# Patient Record
Sex: Female | Born: 1960 | Race: White | Hispanic: No | Smoking: Current every day smoker
Health system: Southern US, Community
[De-identification: ages and names within clinical notes are randomized; demographics above are authoritative.]

## PROBLEM LIST (undated history)

## (undated) DIAGNOSIS — F32A Depression, unspecified: Secondary | ICD-10-CM

## (undated) DIAGNOSIS — F329 Major depressive disorder, single episode, unspecified: Secondary | ICD-10-CM

## (undated) DIAGNOSIS — I10 Essential (primary) hypertension: Secondary | ICD-10-CM

## (undated) DIAGNOSIS — F419 Anxiety disorder, unspecified: Secondary | ICD-10-CM

## (undated) HISTORY — DX: Anxiety disorder, unspecified: F41.9

## (undated) HISTORY — DX: Depression, unspecified: F32.A

## (undated) HISTORY — PX: ABDOMINAL HYSTERECTOMY: SHX81

---
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
## 2008-12-04 HISTORY — PX: ABDOMINAL HYSTERECTOMY: SHX81

## 2018-12-04 ENCOUNTER — Encounter: Payer: Self-pay | Admitting: Emergency Medicine

## 2018-12-04 ENCOUNTER — Emergency Department
Admission: EM | Admit: 2018-12-04 | Discharge: 2018-12-04 | Disposition: A | Discharge status: Home / Self Care | Payer: Self-pay | Attending: Emergency Medicine | Admitting: Emergency Medicine

## 2018-12-04 ENCOUNTER — Other Ambulatory Visit: Payer: Self-pay

## 2018-12-04 DIAGNOSIS — K2950 Unspecified chronic gastritis without bleeding: Secondary | ICD-10-CM

## 2018-12-04 DIAGNOSIS — I10 Essential (primary) hypertension: Secondary | ICD-10-CM | POA: Insufficient documentation

## 2018-12-04 DIAGNOSIS — E860 Dehydration: Secondary | ICD-10-CM | POA: Insufficient documentation

## 2018-12-04 DIAGNOSIS — F17210 Nicotine dependence, cigarettes, uncomplicated: Secondary | ICD-10-CM | POA: Insufficient documentation

## 2018-12-04 DIAGNOSIS — K2930 Chronic superficial gastritis without bleeding: Secondary | ICD-10-CM | POA: Insufficient documentation

## 2018-12-04 HISTORY — DX: Essential (primary) hypertension: I10

## 2018-12-04 LAB — LIPASE, BLOOD: Lipase: 19 U/L (ref 11–51)

## 2018-12-04 LAB — CBC WITH DIFFERENTIAL/PLATELET
Abs Immature Granulocytes: 0.1 10*3/uL — ABNORMAL HIGH (ref 0.00–0.07)
BASOS PCT: 0 %
Basophils Absolute: 0 10*3/uL (ref 0.0–0.1)
Eosinophils Absolute: 0 10*3/uL (ref 0.0–0.5)
Eosinophils Relative: 0 %
HCT: 49.5 % — ABNORMAL HIGH (ref 36.0–46.0)
Hemoglobin: 17.3 g/dL — ABNORMAL HIGH (ref 12.0–15.0)
Immature Granulocytes: 1 %
Lymphocytes Relative: 9 %
Lymphs Abs: 1.4 10*3/uL (ref 0.7–4.0)
MCH: 32.4 pg (ref 26.0–34.0)
MCHC: 34.9 g/dL (ref 30.0–36.0)
MCV: 92.7 fL (ref 80.0–100.0)
Monocytes Absolute: 0.5 10*3/uL (ref 0.1–1.0)
Monocytes Relative: 3 %
NRBC: 0 % (ref 0.0–0.2)
Neutro Abs: 14.1 10*3/uL — ABNORMAL HIGH (ref 1.7–7.7)
Neutrophils Relative %: 87 %
Platelets: 406 10*3/uL — ABNORMAL HIGH (ref 150–400)
RBC: 5.34 MIL/uL — ABNORMAL HIGH (ref 3.87–5.11)
RDW: 14.3 % (ref 11.5–15.5)
WBC: 16.1 10*3/uL — ABNORMAL HIGH (ref 4.0–10.5)

## 2018-12-04 LAB — TROPONIN I: Troponin I: <0.03 ng/mL (ref <0.03)

## 2018-12-04 LAB — COMPREHENSIVE METABOLIC PANEL
ALT: 46 U/L — ABNORMAL HIGH (ref 0–44)
AST: 41 U/L (ref 15–41)
Albumin: 5 g/dL (ref 3.5–5.0)
Alkaline Phosphatase: 70 U/L (ref 38–126)
Anion gap: 16 — ABNORMAL HIGH (ref 5–15)
BUN: 20 mg/dL (ref 6–20)
CO2: 22 mmol/L (ref 22–32)
Calcium: 10.2 mg/dL (ref 8.9–10.3)
Chloride: 97 mmol/L — ABNORMAL LOW (ref 98–111)
Creatinine, Ser: 1.16 mg/dL — ABNORMAL HIGH (ref 0.44–1.00)
GFR calc Af Amer: >60 mL/min (ref >60)
GFR calc non Af Amer: 53 mL/min — ABNORMAL LOW (ref >60)
Glucose, Bld: 206 mg/dL — ABNORMAL HIGH (ref 70–99)
POTASSIUM: 3.6 mmol/L (ref 3.5–5.1)
Sodium: 135 mmol/L (ref 135–145)
Total Bilirubin: 1.1 mg/dL (ref 0.3–1.2)
Total Protein: 8.8 g/dL — ABNORMAL HIGH (ref 6.5–8.1)

## 2018-12-04 MED ORDER — ONDANSETRON HCL 4 MG/2ML IJ SOLN
4.0000 mg | Freq: Once | INTRAMUSCULAR | Status: AC
  Administered 2018-12-04: 4 mg via INTRAVENOUS
  Filled 2018-12-04: qty 2

## 2018-12-04 MED ORDER — SODIUM CHLORIDE 0.9 % IV BOLUS
1000.0000 mL | Freq: Once | INTRAVENOUS | Status: AC
  Administered 2018-12-04: 1000 mL via INTRAVENOUS

## 2018-12-04 MED ORDER — PANTOPRAZOLE SODIUM 40 MG IV SOLR
40.0000 mg | Freq: Once | INTRAVENOUS | Status: AC
  Administered 2018-12-04: 40 mg via INTRAVENOUS
  Filled 2018-12-04: qty 40

## 2018-12-04 MED ORDER — SUCRALFATE 1 G PO TABS: 1.0000 g | ORAL_TABLET | Freq: Four times a day (QID) | ORAL | 0 refills | Status: DC | PRN

## 2018-12-04 MED ORDER — MISOPROSTOL 200 MCG PO TABS
200.0000 ug | ORAL_TABLET | Freq: Once | ORAL | Status: AC
  Administered 2018-12-04: 200 ug via ORAL
  Filled 2018-12-04: qty 1

## 2018-12-04 MED ORDER — ONDANSETRON 4 MG PO TBDP: 4.0000 mg | ORAL_TABLET | Freq: Three times a day (TID) | ORAL | 0 refills | Status: DC | PRN

## 2018-12-04 MED ORDER — ALUM & MAG HYDROXIDE-SIMETH 200-200-20 MG/5ML PO SUSP
30.0000 mL | Freq: Once | ORAL | Status: AC
  Administered 2018-12-04: 30 mL via ORAL
  Filled 2018-12-04: qty 30

## 2018-12-04 MED ORDER — PANTOPRAZOLE SODIUM 40 MG PO TBEC: 40.0000 mg | DELAYED_RELEASE_TABLET | Freq: Every day | ORAL | 0 refills | Status: DC

## 2018-12-04 MED ORDER — SUCRALFATE 1 G PO TABS
1.0000 g | ORAL_TABLET | Freq: Once | ORAL | Status: AC
  Administered 2018-12-04: 1 g via ORAL
  Filled 2018-12-04: qty 1

## 2018-12-04 MED ORDER — LORAZEPAM 2 MG/ML IJ SOLN
1.0000 mg | Freq: Once | INTRAMUSCULAR | Status: AC
  Administered 2018-12-04: 1 mg via INTRAVENOUS
  Filled 2018-12-04: qty 1

## 2018-12-04 MED ORDER — MORPHINE SULFATE (PF) 4 MG/ML IV SOLN
4.0000 mg | Freq: Once | INTRAVENOUS | Status: AC
  Administered 2018-12-04: 4 mg via INTRAVENOUS
  Filled 2018-12-04: qty 1

## 2018-12-04 MED ORDER — HYDROCODONE-ACETAMINOPHEN 5-325 MG PO TABS
2.0000 | ORAL_TABLET | Freq: Once | ORAL | Status: AC
  Administered 2018-12-04: 2 via ORAL
  Filled 2018-12-04: qty 2

## 2018-12-04 MED ORDER — FAMOTIDINE IN NACL 20-0.9 MG/50ML-% IV SOLN
20.0000 mg | Freq: Once | INTRAVENOUS | Status: AC
  Administered 2018-12-04: 20 mg via INTRAVENOUS
  Filled 2018-12-04: qty 50

## 2018-12-04 NOTE — Discharge Instructions (Signed)
Please make sure you remain well hydrated and follow up with the GI specialist next week for a recheck.  Resume taking your protonix every day.  Return to the ED for any concerns.  It was a pleasure to take care of you today, and thank you for coming to our emergency department.  If you have any questions or concerns before leaving please ask the nurse to grab me and I'm more than happy to go through your aftercare instructions again.  If you were prescribed any opioid pain medication today such as Norco, Vicodin, Percocet, morphine, hydrocodone, or oxycodone please make sure you do not drive when you are taking this medication as it can alter your ability to drive safely.  If you have any concerns once you are home that you are not improving or are in fact getting worse before you can make it to your follow-up appointment, please do not hesitate to call 911 and come back for further evaluation.  Merrily Brittle, MD  Results for orders placed or performed during the hospital encounter of 12/04/18  CBC with Differential  Result Value Ref Range   WBC 16.1 (H) 4.0 - 10.5 K/uL   RBC 5.34 (H) 3.87 - 5.11 MIL/uL   Hemoglobin 17.3 (H) 12.0 - 15.0 g/dL   HCT 01.0 (H) 07.1 - 21.9 %   MCV 92.7 80.0 - 100.0 fL   MCH 32.4 26.0 - 34.0 pg   MCHC 34.9 30.0 - 36.0 g/dL   RDW 75.8 83.2 - 54.9 %   Platelets 406 (H) 150 - 400 K/uL   nRBC 0.0 0.0 - 0.2 %   Neutrophils Relative % 87 %   Neutro Abs 14.1 (H) 1.7 - 7.7 K/uL   Lymphocytes Relative 9 %   Lymphs Abs 1.4 0.7 - 4.0 K/uL   Monocytes Relative 3 %   Monocytes Absolute 0.5 0.1 - 1.0 K/uL   Eosinophils Relative 0 %   Eosinophils Absolute 0.0 0.0 - 0.5 K/uL   Basophils Relative 0 %   Basophils Absolute 0.0 0.0 - 0.1 K/uL   Immature Granulocytes 1 %   Abs Immature Granulocytes 0.10 (H) 0.00 - 0.07 K/uL  Comprehensive metabolic panel  Result Value Ref Range   Sodium 135 135 - 145 mmol/L   Potassium 3.6 3.5 - 5.1 mmol/L   Chloride 97 (L) 98 - 111  mmol/L   CO2 22 22 - 32 mmol/L   Glucose, Bld 206 (H) 70 - 99 mg/dL   BUN 20 6 - 20 mg/dL   Creatinine, Ser 8.26 (H) 0.44 - 1.00 mg/dL   Calcium 41.5 8.9 - 83.0 mg/dL   Total Protein 8.8 (H) 6.5 - 8.1 g/dL   Albumin 5.0 3.5 - 5.0 g/dL   AST 41 15 - 41 U/L   ALT 46 (H) 0 - 44 U/L   Alkaline Phosphatase 70 38 - 126 U/L   Total Bilirubin 1.1 0.3 - 1.2 mg/dL   GFR calc non Af Amer 53 (L) >60 mL/min   GFR calc Af Amer >60 >60 mL/min   Anion gap 16 (H) 5 - 15  Lipase, blood  Result Value Ref Range   Lipase 19 11 - 51 U/L  Troponin I - ONCE - STAT  Result Value Ref Range   Troponin I <0.03 <0.03 ng/mL

## 2018-12-04 NOTE — ED Provider Notes (Signed)
[facility] Emergency Department Provider Note  ____________________________________________   None    (approximate)  I have reviewed the triage vital signs and the nursing notes.   HISTORY  Chief Complaint Emesis   HPI Michele Carroll is a 58 y.o. female who comes to the emergency department with 2 days of left upper quadrant pain nausea vomiting and dehydration.  She has a longstanding history of gastritis and recently ran out of her Protonix.  She moved from [place] to [place] and has not yet found a primary care physician so she purchased over-the-counter omeprazole and resumed taking it about 5 days ago.  She had been off of her PPI for about 3 or 4 weeks prior to this.  Her symptoms have been slow in onset gradually progressive and are now moderate to severe.  She throws up when she is trying to keep down even water.  Her last endoscopy was at age 63 and she has not seen a GI physician since.  She has no history of abdominal surgeries aside from an abdominal hysterectomy.  She denies fevers or chills.  She denies chest pain.  She denies dysuria frequency or hesitancy.  Her symptoms are currently moderate to severe associated with a bitter taste in her mouth and are worsened when trying to eat.    Past Medical History:  Diagnosis Date  . Hypertension     There are no active problems to display for this patient.   Past Surgical History:  Procedure Laterality Date  . ABDOMINAL HYSTERECTOMY      Prior to Admission medications   Medication Sig Start Date End Date Taking? Authorizing Provider  ondansetron (ZOFRAN ODT) 4 MG disintegrating tablet Take 1 tablet (4 mg total) by mouth every 8 (eight) hours as needed for nausea or vomiting. 12/04/18   Merrily Brittle, MD  pantoprazole (PROTONIX) 40 MG tablet Take 1 tablet (40 mg total) by mouth daily. 12/04/18 01/03/19  Merrily Brittle, MD  sucralfate (CARAFATE) 1 g tablet Take 1 tablet (1 g total) by mouth 4  (four) times daily as needed (abdominal pain). 12/04/18 01/03/19  Merrily Brittle, MD    Allergies Patient has no known allergies.  No family history on file.  Social History Social History   Tobacco Use  . Smoking status: Current Every Day Smoker  . Smokeless tobacco: Never Used  Substance Use Topics  . Alcohol use: Not on file  . Drug use: Not on file    Review of Systems Constitutional: No fever/chills Eyes: No visual changes. ENT: No sore throat. Cardiovascular: Denies chest pain. Respiratory: Denies shortness of breath. Gastrointestinal: Positive for abdominal pain.  Positive for nausea, for vomiting.  No diarrhea.  No constipation. Genitourinary: Negative for dysuria. Musculoskeletal: Negative for back pain. Skin: Negative for rash. Neurological: Negative for headaches, focal weakness or numbness.   ____________________________________________   PHYSICAL EXAM:  VITAL SIGNS: ED Triage Vitals  Enc Vitals Group     BP 12/04/18 0333 (!) 146/87     Pulse Rate 12/04/18 0333 86     Resp 12/04/18 0333 20     Temp 12/04/18 0333 97.8 F (36.6 C)     Temp Source 12/04/18 0333 Oral     SpO2 12/04/18 0333 95 %     Weight 12/04/18 0329 210 lb (95.3 kg)     Height 12/04/18 0329 5\' 6"  (1.676 m)     Head Circumference --      Peak Flow --  Pain Score 12/04/18 0329 10     Pain Loc --      Pain Edu? --      Excl. in GC? --     Constitutional: Alert and oriented x4 appears quite uncomfortable although nontoxic in no diaphoresis.  She is holding a vomit bag Eyes: PERRL EOMI. Head: Atraumatic. Nose: No congestion/rhinnorhea. Mouth/Throat: No trismus Neck: No stridor.   Cardiovascular: Normal rate, regular rhythm. Grossly normal heart sounds.  Good peripheral circulation. Respiratory: Normal respiratory effort.  No retractions. Lungs CTAB and moving good air Gastrointestinal: Obese abdomen soft nondistended nontender no rebound or guarding no peritonitis no McBurney's  tenderness negative Rovsing's no costovertebral tenderness Musculoskeletal: No lower extremity edema   Neurologic:  Normal speech and language. No gross focal neurologic deficits are appreciated. Skin:  Skin is warm, dry and intact. No rash noted. Psychiatric: Mood and affect are normal. Speech and behavior are normal.    ____________________________________________   DIFFERENTIAL includes but not limited to  Appendicitis, diverticulitis, bowel obstruction, pyelonephritis, nephrolithiasis, gastritis, duodenitis ____________________________________________   LABS (all labs ordered are listed, but only abnormal results are displayed)  Labs Reviewed  CBC WITH DIFFERENTIAL/PLATELET - Abnormal; Notable for the following components:      Result Value   WBC 16.1 (*)    RBC 5.34 (*)    Hemoglobin 17.3 (*)    HCT 49.5 (*)    Platelets 406 (*)    Neutro Abs 14.1 (*)    Abs Immature Granulocytes 0.10 (*)    All other components within normal limits  COMPREHENSIVE METABOLIC PANEL - Abnormal; Notable for the following components:   Chloride 97 (*)    Glucose, Bld 206 (*)    Creatinine, Ser 1.16 (*)    Total Protein 8.8 (*)    ALT 46 (*)    GFR calc non Af Amer 53 (*)    Anion gap 16 (*)    All other components within normal limits  LIPASE, BLOOD  TROPONIN I  URINALYSIS, COMPLETE (UACMP) WITH MICROSCOPIC    Lab work reviewed by me with elevated white count which is nonspecific.  She has an increased hemoglobin which is consistent with dehydration and hemoconcentration.  She has a slight anion gap likely secondary to vomiting and acidemia __________________________________________  EKG  ED ECG REPORT I, Merrily BrittleNeil Lee Kuang, the attending physician, personally viewed and interpreted this ECG.  Date: 12/04/2018 EKG Time:  Rate: 81 Rhythm: normal sinus rhythm QRS Axis: Rightward axis Intervals: normal ST/T Wave abnormalities: normal Narrative Interpretation: no evidence of acute  ischemia  ____________________________________________  RADIOLOGY   ____________________________________________   PROCEDURES  Procedure(s) performed: no  Procedures  Critical Care performed: no  ____________________________________________   INITIAL IMPRESSION / ASSESSMENT AND PLAN / ED COURSE  Pertinent labs & imaging results that were available during my care of the patient were reviewed by me and considered in my medical decision making (see chart for details).   As part of my medical decision making, I reviewed the following data within the electronic MEDICAL RECORD NUMBER History obtained from family if available, nursing notes, old chart and ekg, as well as notes from prior ED visits.  Patient comes the emergency department quite uncomfortable appearing with left upper quadrant abdominal pain nausea and vomiting.  Her abdominal exam is quite benign.  Her lab work shows hemoconcentration and dehydration.  Her constellation of symptoms are most consistent with a gastric etiology and are likely related to her abrupt cessation of her PPI.  I discussed with the patient and her husband that PPIs can take at least 5 to 7 days to begin working again so the omeprazole that she recently started has likely yet to begin working.  We will fluid resuscitate her and treat her symptomatically with Carafate, Cytotec, Maalox, Pepcid, and a first dose of Protonix here and I will help her establish care with gastroenterology as an outpatient.  I anticipate discharge with resumption of her Protonix.      ____________________________________________   FINAL CLINICAL IMPRESSION(S) / ED DIAGNOSES  Final diagnoses:  Chronic gastritis without bleeding, unspecified gastritis type  Dehydration      NEW MEDICATIONS STARTED DURING THIS VISIT:  New Prescriptions   ONDANSETRON (ZOFRAN ODT) 4 MG DISINTEGRATING TABLET    Take 1 tablet (4 mg total) by mouth every 8 (eight) hours as needed for nausea or  vomiting.   PANTOPRAZOLE (PROTONIX) 40 MG TABLET    Take 1 tablet (40 mg total) by mouth daily.   SUCRALFATE (CARAFATE) 1 G TABLET    Take 1 tablet (1 g total) by mouth 4 (four) times daily as needed (abdominal pain).     Note:  This document was prepared using Dragon voice recognition software and may include unintentional dictation errors.    Merrily Brittle, MD 12/04/18 [phone]

## 2018-12-04 NOTE — ED Triage Notes (Signed)
Patient ambulatory to triage with steady gait, without difficulty or distress noted; pt reports N/V x 2 days accomp by mid epigastric pain

## 2019-05-06 ENCOUNTER — Other Ambulatory Visit: Payer: Self-pay

## 2019-05-08 ENCOUNTER — Telehealth: Payer: Self-pay | Admitting: Family Medicine

## 2019-05-08 ENCOUNTER — Encounter: Payer: Self-pay | Admitting: Family Medicine

## 2019-05-08 ENCOUNTER — Ambulatory Visit (INDEPENDENT_AMBULATORY_CARE_PROVIDER_SITE_OTHER): Payer: Self-pay

## 2019-05-08 ENCOUNTER — Other Ambulatory Visit: Payer: Self-pay

## 2019-05-08 ENCOUNTER — Ambulatory Visit: Payer: Self-pay | Admitting: Family Medicine

## 2019-05-08 ENCOUNTER — Ambulatory Visit: Payer: Self-pay | Admitting: Internal Medicine

## 2019-05-08 ENCOUNTER — Ambulatory Visit (INDEPENDENT_AMBULATORY_CARE_PROVIDER_SITE_OTHER): Payer: Self-pay | Admitting: Family Medicine

## 2019-05-08 VITALS — BP 138/98 | HR 94 | Temp 98.6°F | Resp 16 | Ht 63.0 in | Wt 223.0 lb

## 2019-05-08 DIAGNOSIS — G43009 Migraine without aura, not intractable, without status migrainosus: Secondary | ICD-10-CM

## 2019-05-08 DIAGNOSIS — E785 Hyperlipidemia, unspecified: Secondary | ICD-10-CM

## 2019-05-08 DIAGNOSIS — J449 Chronic obstructive pulmonary disease, unspecified: Secondary | ICD-10-CM

## 2019-05-08 DIAGNOSIS — F419 Anxiety disorder, unspecified: Secondary | ICD-10-CM

## 2019-05-08 DIAGNOSIS — I10 Essential (primary) hypertension: Secondary | ICD-10-CM

## 2019-05-08 DIAGNOSIS — F17210 Nicotine dependence, cigarettes, uncomplicated: Secondary | ICD-10-CM

## 2019-05-08 DIAGNOSIS — M7989 Other specified soft tissue disorders: Secondary | ICD-10-CM

## 2019-05-08 MED ORDER — HYDROCHLOROTHIAZIDE 25 MG PO TABS: 25.0000 mg | ORAL_TABLET | Freq: Every day | ORAL | 1 refills | Status: DC

## 2019-05-08 MED ORDER — ALPRAZOLAM 0.5 MG PO TABS: 0.5000 mg | ORAL_TABLET | Freq: Every evening | ORAL | 0 refills | Status: DC | PRN

## 2019-05-08 MED ORDER — ONDANSETRON 4 MG PO TBDP: 4.0000 mg | ORAL_TABLET | Freq: Three times a day (TID) | ORAL | 0 refills | Status: DC | PRN

## 2019-05-08 MED ORDER — DULOXETINE HCL 30 MG PO CPEP: 30.0000 mg | ORAL_CAPSULE | Freq: Every day | ORAL | 1 refills | Status: DC

## 2019-05-08 MED ORDER — LISINOPRIL 20 MG PO TABS: 20.0000 mg | ORAL_TABLET | Freq: Every day | ORAL | 1 refills | Status: DC

## 2019-05-08 MED ORDER — ALBUTEROL SULFATE HFA 108 (90 BASE) MCG/ACT IN AERS: 2.0000 | INHALATION_SPRAY | Freq: Four times a day (QID) | RESPIRATORY_TRACT | 0 refills | Status: AC | PRN

## 2019-05-08 MED ORDER — BUTALBITAL-APAP-CAFFEINE 50-325-40 MG PO TABS: 2.0000 | ORAL_TABLET | Freq: Two times a day (BID) | ORAL | 0 refills | Status: DC | PRN

## 2019-05-08 NOTE — Telephone Encounter (Signed)
Please call [facility] to cancel Rx  Sent to [facility] instead  Thanks  LG

## 2019-05-08 NOTE — Patient Instructions (Signed)
Begin ANORO: 1 puff inhaled daily

## 2019-05-08 NOTE — Progress Notes (Signed)
Subjective:    Patient ID: Gustavus MessingJoann Holwerda, female    DOB: 07/21/1961, 58 y.o.   MRN: [id number]  HPI   Patient presents to clinic for establishing primary care.  [place]  States she does have a history of elevated BP and migraines and has run out of her medications.  Patient also had been on Cymbalta for anxiety, and has also run out of that.  She actually wanted to see how she felt without the Cymbalta for a few weeks, and has noticed she does feel she needs to come back on Cymbalta.  Feels her anxiety increasing and the longer she is without the medication.  Denies any SI or HI.  Over the past 6 months patient has noticed that her BPs have been a little bit more difficult to control, she is started developed swelling in her lower extremities by the end of the day and also shortness of breath has becoming worse especially with walking longer distances.  Patient is a smoker and has been a smoker of about a pack a day for at least 40 years.  Per patient she has never been diagnosed with COPD or any sort of breathing problem.  Patient Active Problem List   Diagnosis Date Noted  . Cigarette smoker 05/09/2019  . Essential hypertension 05/09/2019  . Swelling of both lower extremities 05/09/2019  . Anxiety 05/09/2019  . Migraine without aura and without status migrainosus, not intractable 05/09/2019  . Chronic obstructive pulmonary disease (HCC) 05/09/2019   Social History   Tobacco Use  . Smoking status: Current Every Day Smoker  . Smokeless tobacco: Never Used  Substance Use Topics  . Alcohol use: Yes   Family History  Problem Relation Age of Onset  . Cancer Mother   . Cancer Father    Past Surgical History:  Procedure Laterality Date  . ABDOMINAL HYSTERECTOMY  2010  . CESAREAN SECTION  1986, 1996  . TONSILLECTOMY  1970   Review of Systems  Constitutional: Negative for chills, fatigue and fever.  HENT: Negative for congestion, ear pain,  sinus pain and sore throat.   Eyes: Negative.   Respiratory: Negative for cough and wheezing. +SOB Cardiovascular: Negative for chest pain, palpitations. +leg swelling bilat.  Gastrointestinal: Negative for abdominal pain, diarrhea, nausea and vomiting.  Genitourinary: Negative for dysuria, frequency and urgency.  Musculoskeletal: Negative for arthralgias and myalgias.  Skin: Negative for color change, pallor and rash.  Neurological: Negative for syncope, light-headedness and headaches.  Psychiatric/Behavioral: The patient is not nervous/anxious.       Objective:   Physical Exam Vitals signs and nursing note reviewed.  Constitutional:      General: She is not in acute distress.    Appearance: She is obese. She is not ill-appearing, toxic-appearing or diaphoretic.  HENT:     Head: Normocephalic and atraumatic.  Eyes:     General: No scleral icterus.    Extraocular Movements: Extraocular movements intact.     Conjunctiva/sclera: Conjunctivae normal.     Pupils: Pupils are equal, round, and reactive to light.  Neck:     Musculoskeletal: Neck supple. No neck rigidity.  Cardiovascular:     Rate and Rhythm: Normal rate and regular rhythm.     Heart sounds: Normal heart sounds. No murmur. No friction rub. No gallop.   Pulmonary:     Effort: Pulmonary effort is normal. No respiratory distress.     Breath sounds: Normal breath sounds. No wheezing, rhonchi or  rales.  Musculoskeletal:     Right lower leg: Edema (+1) present.     Left lower leg: Edema (+1) present.  Skin:    General: Skin is warm and dry.     Coloration: Skin is not jaundiced or pale.  Neurological:     Mental Status: She is alert and oriented to person, place, and time.     Gait: Gait normal.  Psychiatric:        Mood and Affect: Mood normal.        Behavior: Behavior normal.    Today's Vitals   05/08/19 1320  BP: (!) 138/98  Pulse: 94  Resp: 16  Temp: 98.6 F (37 C)  SpO2: 94%  Weight: 223 lb (101.2 kg)   Height: 5\' 3"  (1.6 m)   Body mass index is 39.5 kg/m.     Assessment & Plan:    A total of 60  minutes were spent face-to-face with the patient during this encounter and over half of that time was spent on counseling and coordination of care. The patient was counseled on resuming medications for BP and anxiety, new medications were added to help BP and control swelling, testing we will do to help investigate breathing difficulty.    1. Cigarette smoker Greater than 3 minutes spent discussing smoking cessation with patient.  Strongly advised cutting back and quitting smoking completely.  Discussed different options for smoking cessation including Chantix, nicotine patch, Nicorette gum nicotine lozenges.  Patient would prefer not to do Chantix.  She will try nicotine gum and chew a piece rather than having a cigarette when she has a craving.  Advised patient the long-term effects of continued smoking including issues with breathing, vascular disease, multiple cancers, organ failure and even death.  2. Chronic obstructive pulmonary disease, unspecified COPD type (HCC)  Patient has never officially been diagnosed with COPD from pulmonology or previous PCPs.  Due to patient's 40-year smoking history, increasing shortness of breath over the past 6 months I do have strong suspicion for COPD.  We will treat with her beginning Anoro 1 puff inhaled daily, samples of Anoro given to patient as she currently does not have health insurance.  Also she will use albuterol if needed.  Discussed possibility of referral to pulmonology for full pulmonary function testing.  3. Essential hypertension Patient has run out of lisinopril.  We will get her back on this medication as it worked well for her in the past.  We will also add on hydrochlorothiazide for BP control as well as swelling of lower extremity control. 4. Swelling of both lower extremities  Discussed different causes of swelling in lower extremities  with patient.  Discussed that the swelling could be a result of long-term hypertension causing vascular problems which lead to swelling in legs.  Hydrochlorothiazide will help combat swelling.  Also advised to elevate legs whenever sitting and also to wear compression style stockings or socks.  We will also get echocardiogram to further investigate heart and look for any signs of congestive heart failure.  EKG performed in clinic and reviewed by me.  It does show normal sinus rhythm and no obvious sign of past myocardial infarction's or current acute cardiac issue.  5. Anxiety Patient was on Cymbalta previously and this did work well to help anxiety.  We will resume Cymbalta at 30 mg dose and then if after 1 month refill increasing doses needed we will bump up to 60 mg.  Patient also does use Xanax as  needed.  Xanax refill sent in for patient.  [facility] PMP registry checked and is appropriate for this refill.  6. Migraine without aura and without status migrainosus, not intractable Patient has history of occasional migraine.  Does not occur often, but when does occur she does use Fioricet as abortive therapy.  Fioricet does work well to treat her migraines and she likes to have on hand in case one comes on.  Patient states if she does not have an abortive therapy available, she has had instances where her migraines became very severe causing vomiting and her going to the emergency room and she would like to avoid that if possible.   CXR and lab work done in clinic  Patient will follow-up here in 2-3 weeks for recheck on BP after addition of hydrochlorothiazide and to follow-up on breathing after starting a new medications.  Patient is aware she will be contacted in regards to setting up the echocardiogram.

## 2019-05-09 ENCOUNTER — Telehealth: Payer: Self-pay | Admitting: *Deleted

## 2019-05-09 DIAGNOSIS — J449 Chronic obstructive pulmonary disease, unspecified: Secondary | ICD-10-CM | POA: Insufficient documentation

## 2019-05-09 DIAGNOSIS — I10 Essential (primary) hypertension: Secondary | ICD-10-CM | POA: Insufficient documentation

## 2019-05-09 DIAGNOSIS — F17210 Nicotine dependence, cigarettes, uncomplicated: Secondary | ICD-10-CM | POA: Insufficient documentation

## 2019-05-09 DIAGNOSIS — G43009 Migraine without aura, not intractable, without status migrainosus: Secondary | ICD-10-CM | POA: Insufficient documentation

## 2019-05-09 DIAGNOSIS — M7989 Other specified soft tissue disorders: Secondary | ICD-10-CM | POA: Insufficient documentation

## 2019-05-09 DIAGNOSIS — F419 Anxiety disorder, unspecified: Secondary | ICD-10-CM | POA: Insufficient documentation

## 2019-05-09 LAB — COMPREHENSIVE METABOLIC PANEL
ALT: 59 U/L — ABNORMAL HIGH (ref 0–35)
AST: 60 U/L — ABNORMAL HIGH (ref 0–37)
Albumin: 4.2 g/dL (ref 3.5–5.2)
Alkaline Phosphatase: 60 U/L (ref 39–117)
BUN: 11 mg/dL (ref 6–23)
CO2: 26 mEq/L (ref 19–32)
Calcium: 9.8 mg/dL (ref 8.4–10.5)
Chloride: 105 mEq/L (ref 96–112)
Creatinine, Ser: 0.88 mg/dL (ref 0.40–1.20)
GFR: 66.19 mL/min (ref >60.00)
Glucose, Bld: 82 mg/dL (ref 70–99)
Potassium: 4.3 mEq/L (ref 3.5–5.1)
Sodium: 140 mEq/L (ref 135–145)
Total Bilirubin: 0.5 mg/dL (ref 0.2–1.2)
Total Protein: 6.9 g/dL (ref 6.0–8.3)

## 2019-05-09 LAB — CBC WITH DIFFERENTIAL/PLATELET
Basophils Absolute: 0.1 10*3/uL (ref 0.0–0.1)
Basophils Relative: 1.5 % (ref 0.0–3.0)
Eosinophils Absolute: 0.2 10*3/uL (ref 0.0–0.7)
Eosinophils Relative: 1.6 % (ref 0.0–5.0)
HCT: 42 % (ref 36.0–46.0)
Hemoglobin: 14.1 g/dL (ref 12.0–15.0)
Lymphocytes Relative: 31 % (ref 12.0–46.0)
Lymphs Abs: 3 10*3/uL (ref 0.7–4.0)
MCHC: 33.5 g/dL (ref 30.0–36.0)
MCV: 105.2 fl — ABNORMAL HIGH (ref 78.0–100.0)
Monocytes Absolute: 0.7 10*3/uL (ref 0.1–1.0)
Monocytes Relative: 7.4 % (ref 3.0–12.0)
Neutro Abs: 5.7 10*3/uL (ref 1.4–7.7)
Neutrophils Relative %: 58.5 % (ref 43.0–77.0)
Platelets: 352 10*3/uL (ref 150.0–400.0)
RBC: 4 Mil/uL (ref 3.87–5.11)
RDW: 15.6 % — ABNORMAL HIGH (ref 11.5–15.5)
WBC: 9.8 10*3/uL (ref 4.0–10.5)

## 2019-05-09 LAB — LIPID PANEL
Cholesterol: 209 mg/dL — ABNORMAL HIGH (ref 0–200)
HDL: 53.3 mg/dL (ref >39.00)
LDL Cholesterol: 129 mg/dL — ABNORMAL HIGH (ref 0–99)
NonHDL: 155.93
Total CHOL/HDL Ratio: 4
Triglycerides: 135 mg/dL (ref 0.0–149.0)
VLDL: 27 mg/dL (ref 0.0–40.0)

## 2019-05-09 LAB — HEMOGLOBIN A1C: Hgb A1c MFr Bld: 5.8 % (ref 4.6–6.5)

## 2019-05-09 NOTE — Telephone Encounter (Signed)
Copied from CRM [id number]. Topic: Quick Communication - Rx Refill/Question >> May 09, 2019 10:13 AM Lynne Logan D wrote: Medication: DULoxetine (CYMBALTA) 30 MG capsule / Pt stated her rx for duloxetine was for 30mg  and she had been taking 60mg . She would like to know if a new rx can be sent in. Please advis. Requesting callback  Has the patient contacted their pharmacy? Yes.   (Agent: If no, request that the patient contact the pharmacy for the refill.) (Agent: If yes, when and what did the pharmacy advise?)  Preferred Pharmacy (with phone number or street name): [facility] [address], [place] - [address] [phone] (Phone) [phone] (Fax)    Agent: Please be advised that RX refills may take up to 3 business days. We ask that you follow-up with your pharmacy.

## 2019-05-09 NOTE — Telephone Encounter (Signed)
I called and had the Rx cancelled

## 2019-05-09 NOTE — Telephone Encounter (Signed)
I wanted to start back on 30 first, then bump up to 60 mg after the first month if needed  Let me know if pt is oK with this plan  LG

## 2019-05-09 NOTE — Telephone Encounter (Signed)
Pt called [facility] Medication: DULoxetine (CYMBALTA) 30 MG capsule / Pt stated her rx for duloxetine was for 30mg  and she had been taking 60mg . She would like to know if a new rx can be sent in. Please advis. Requesting callback

## 2019-05-09 NOTE — Telephone Encounter (Signed)
Called Pt and spoke to her about the plan of starting on the 30 mg and then bumping up to the 60 mg later after the first month. Pt agreed with this plan and stated that she understood.

## 2019-05-13 MED ORDER — ATORVASTATIN CALCIUM 10 MG PO TABS: 10.0000 mg | ORAL_TABLET | Freq: Every day | ORAL | 3 refills | Status: AC

## 2019-05-13 NOTE — Addendum Note (Signed)
Addended by: Philis Nettle on: 05/13/2019 02:38 PM   Modules accepted: Orders

## 2019-05-29 ENCOUNTER — Other Ambulatory Visit: Payer: Self-pay

## 2019-05-29 ENCOUNTER — Ambulatory Visit
Admission: RE | Admit: 2019-05-29 | Discharge: 2019-05-29 | Disposition: A | Discharge status: Home / Self Care | Payer: Self-pay | Source: Ambulatory Visit | Attending: Family Medicine | Admitting: Family Medicine

## 2019-05-29 DIAGNOSIS — F17210 Nicotine dependence, cigarettes, uncomplicated: Secondary | ICD-10-CM | POA: Insufficient documentation

## 2019-05-29 DIAGNOSIS — I10 Essential (primary) hypertension: Secondary | ICD-10-CM | POA: Insufficient documentation

## 2019-05-29 DIAGNOSIS — M7989 Other specified soft tissue disorders: Secondary | ICD-10-CM | POA: Insufficient documentation

## 2019-05-29 NOTE — Progress Notes (Signed)
*  PRELIMINARY RESULTS* Echocardiogram 2D Echocardiogram has been performed.  Sherrie Sport 05/29/2019, 11:44 AM

## 2019-06-05 ENCOUNTER — Other Ambulatory Visit: Payer: Self-pay

## 2019-06-05 ENCOUNTER — Ambulatory Visit (INDEPENDENT_AMBULATORY_CARE_PROVIDER_SITE_OTHER): Payer: Self-pay | Admitting: Family Medicine

## 2019-06-05 ENCOUNTER — Encounter: Payer: Self-pay | Admitting: Family Medicine

## 2019-06-05 VITALS — BP 138/88 | HR 101 | Temp 98.4°F | Resp 18 | Ht 66.0 in | Wt 218.0 lb

## 2019-06-05 DIAGNOSIS — F32A Depression, unspecified: Secondary | ICD-10-CM

## 2019-06-05 DIAGNOSIS — J449 Chronic obstructive pulmonary disease, unspecified: Secondary | ICD-10-CM

## 2019-06-05 DIAGNOSIS — F419 Anxiety disorder, unspecified: Secondary | ICD-10-CM

## 2019-06-05 DIAGNOSIS — F17210 Nicotine dependence, cigarettes, uncomplicated: Secondary | ICD-10-CM

## 2019-06-05 DIAGNOSIS — M7989 Other specified soft tissue disorders: Secondary | ICD-10-CM

## 2019-06-05 DIAGNOSIS — F329 Major depressive disorder, single episode, unspecified: Secondary | ICD-10-CM

## 2019-06-05 DIAGNOSIS — I10 Essential (primary) hypertension: Secondary | ICD-10-CM

## 2019-06-05 NOTE — Patient Instructions (Signed)
Increase cymbalta to 120 mg per day  Use Anoro daily

## 2019-06-05 NOTE — Progress Notes (Signed)
Subjective:    Patient ID: Michele Carroll, female    DOB: [birth year], 58 y.o.   MRN: [id number]  HPI   Patient is a clinic for follow-up on breathing, blood pressure, lower extremity swelling, somatization and anxiety/depression.  Patient did feel good when using the Anoro samples she was given.  States felt that her shortness of breath improved and did not rely on the albuterol inhaler.  Patient continues to smoke approximately 1 pack/day, has been trying to wean down but it has been hard.  Patient's mood has been up and down over the past few weeks.  States the pandemic causing her to have additional difficulty in finding a job has been overwhelming and stressful.  Patient moved to [facility] a little less than a year ago, and did not plan to be out of the job this long.  States she does have good support from significant other who she lives with, but still feels down and upset and overwhelmed when thinking about bills to have to get paid, trying to find a job.  Patient is currently taking Cymbalta 60 mg daily, and overall does feel good on this medicine.  States she is just having a bad couple of days.  In the past she had been on a higher dose, and did feel better on the higher dose of 120 mg/day.  Denies any SI or HI.  Patient has been monitoring blood pressure at home and since getting back on lisinopril has been running in the 130s over 80s.  Also since adding on hydrochlorothiazide, lower extremity swelling has improved.  States will have some swelling in the ankles after being on feet all day, but usually with elevation is improved.  Denies any chest pain or palpitations.  Patient is tolerating statin without any adverse effects.  Denies any body aches or abdominal pains.   Patient Active Problem List   Diagnosis Date Noted  . Depressive disorder 06/05/2019  . Cigarette smoker 05/09/2019  . Essential hypertension 05/09/2019  . Swelling of both lower extremities 05/09/2019  .  Anxiety 05/09/2019  . Migraine without aura and without status migrainosus, not intractable 05/09/2019  . Chronic obstructive pulmonary disease ([place]) 05/09/2019   Social History   Tobacco Use  . Smoking status: Current Every Day Smoker  . Smokeless tobacco: Never Used  Substance Use Topics  . Alcohol use: Yes   Review of Systems  Constitutional: Negative for chills, fatigue and fever.  HENT: Negative for congestion, ear pain, sinus pain and sore throat.   Eyes: Negative.   Respiratory: Negative for cough, shortness of breath and wheezing.   Cardiovascular: Negative for chest pain, palpitations and leg swelling.  Gastrointestinal: Negative for abdominal pain, diarrhea, nausea and vomiting.  Genitourinary: Negative for dysuria, frequency and urgency.  Musculoskeletal: Negative for arthralgias and myalgias.  Skin: Negative for color change, pallor and rash.  Neurological: Negative for syncope, light-headedness and headaches.  Psychiatric/Behavioral: +increased stress/anxiety.       Objective:   Physical Exam Vitals signs and nursing note reviewed.  Constitutional:      General: She is not in acute distress.    Appearance: She is not toxic-appearing.  HENT:     Head: Normocephalic and atraumatic.  Eyes:     General: No scleral icterus.    Extraocular Movements: Extraocular movements intact.     Conjunctiva/sclera: Conjunctivae normal.     Pupils: Pupils are equal, round, and reactive to light.  Cardiovascular:     Rate and  Rhythm: Regular rhythm.     Heart sounds: Normal heart sounds. No murmur. No friction rub. No gallop.   Pulmonary:     Effort: Pulmonary effort is normal. No respiratory distress.     Breath sounds: Normal breath sounds. No wheezing, rhonchi or rales.  Musculoskeletal:     Right lower leg: No edema.     Left lower leg: No edema.  Skin:    General: Skin is warm and dry.     Coloration: Skin is not jaundiced or pale.  Neurological:     General: No  focal deficit present.     Mental Status: She is alert and oriented to person, place, and time.     Gait: Gait normal.  Psychiatric:        Attention and Perception: Attention and perception normal.        Mood and Affect: Affect is tearful (tearful when discussing increased stress).        Speech: Speech normal.        Behavior: Behavior normal. Behavior is cooperative.        Cognition and Memory: Cognition normal.        Judgment: Judgment normal.     Today's Vitals   06/05/19 1311  BP: 138/88  Pulse: (!) 101  Resp: 18  Temp: 98.4 F (36.9 C)  TempSrc: Oral  SpO2: 91%  Weight: 218 lb (98.9 kg)  Height: 5\' 6"  (1.676 m)   Body mass index is 35.19 kg/m.    Assessment & Plan:   Anxiety and depression-patient is agreeable to have a counseling referral placed.  States that she feels it will be helpful to have someone to talk to and is very interested in seeing a counselor.  Patient did have success with higher dose of Cymbalta in the past, so we will increase her dose to 90 mg daily for 1 week, then go up to 120 mg daily.  COPD/cigarette smoker- patient did feel improved when using Anoro.  She still does not have health insurance.  We will give her more Anoro samples from our sample supply.  I will also recheck to our pharmacist to see if there are any patient assistance programs available for COPD medications.  Also recommended to patient that she work on cutting back and completely quitting smoking.  Patient states she is trying to wean down.  Declines any prescription for nicotine patches or nicotine gum.  Patient understands the risks of continued smoking including worsening breathing, heart and vascular disease, stroke, cancers and even death.  Essential hypertension-blood pressure is improved since getting back on lisinopril and addition of hydrochlorothiazide  Lower extremity swelling-lower extremity swelling improved since hydrochlorothiazide addition and patient's  echocardiogram is stable.  We will have patient follow-up in 3 to 4 weeks for recheck on mood after increasing Cymbalta.  She is aware she currently clinic sooner if any issues arise.

## 2019-06-09 ENCOUNTER — Emergency Department: Payer: Self-pay

## 2019-06-09 ENCOUNTER — Other Ambulatory Visit: Payer: Self-pay

## 2019-06-09 ENCOUNTER — Encounter: Payer: Self-pay | Admitting: Emergency Medicine

## 2019-06-09 ENCOUNTER — Emergency Department
Admission: EM | Admit: 2019-06-09 | Discharge: 2019-06-09 | Disposition: A | Discharge status: Home / Self Care | Payer: Self-pay | Attending: Emergency Medicine | Admitting: Emergency Medicine

## 2019-06-09 DIAGNOSIS — K2970 Gastritis, unspecified, without bleeding: Secondary | ICD-10-CM | POA: Insufficient documentation

## 2019-06-09 DIAGNOSIS — J449 Chronic obstructive pulmonary disease, unspecified: Secondary | ICD-10-CM | POA: Insufficient documentation

## 2019-06-09 DIAGNOSIS — R112 Nausea with vomiting, unspecified: Secondary | ICD-10-CM

## 2019-06-09 DIAGNOSIS — I10 Essential (primary) hypertension: Secondary | ICD-10-CM | POA: Insufficient documentation

## 2019-06-09 DIAGNOSIS — Z79899 Other long term (current) drug therapy: Secondary | ICD-10-CM | POA: Insufficient documentation

## 2019-06-09 DIAGNOSIS — F17200 Nicotine dependence, unspecified, uncomplicated: Secondary | ICD-10-CM | POA: Insufficient documentation

## 2019-06-09 LAB — COMPREHENSIVE METABOLIC PANEL
ALT: 47 U/L — ABNORMAL HIGH (ref 0–44)
AST: 61 U/L — ABNORMAL HIGH (ref 15–41)
Albumin: 4.5 g/dL (ref 3.5–5.0)
Alkaline Phosphatase: 69 U/L (ref 38–126)
Anion gap: 12 (ref 5–15)
BUN: 9 mg/dL (ref 6–20)
CO2: 26 mmol/L (ref 22–32)
Calcium: 8.6 mg/dL — ABNORMAL LOW (ref 8.9–10.3)
Chloride: 94 mmol/L — ABNORMAL LOW (ref 98–111)
Creatinine, Ser: 0.89 mg/dL (ref 0.44–1.00)
GFR calc Af Amer: >60 mL/min (ref >60)
GFR calc non Af Amer: >60 mL/min (ref >60)
Glucose, Bld: 170 mg/dL — ABNORMAL HIGH (ref 70–99)
Potassium: 3.2 mmol/L — ABNORMAL LOW (ref 3.5–5.1)
Sodium: 132 mmol/L — ABNORMAL LOW (ref 135–145)
Total Bilirubin: 1.4 mg/dL — ABNORMAL HIGH (ref 0.3–1.2)
Total Protein: 7.9 g/dL (ref 6.5–8.1)

## 2019-06-09 LAB — CBC
HCT: 41 % (ref 36.0–46.0)
Hemoglobin: 14.7 g/dL (ref 12.0–15.0)
MCH: 35 pg — ABNORMAL HIGH (ref 26.0–34.0)
MCHC: 35.9 g/dL (ref 30.0–36.0)
MCV: 97.6 fL (ref 80.0–100.0)
Platelets: 261 10*3/uL (ref 150–400)
RBC: 4.2 MIL/uL (ref 3.87–5.11)
RDW: 13.8 % (ref 11.5–15.5)
WBC: 9.7 10*3/uL (ref 4.0–10.5)
nRBC: 0 % (ref 0.0–0.2)

## 2019-06-09 LAB — LIPASE, BLOOD: Lipase: 51 U/L (ref 11–51)

## 2019-06-09 LAB — URINALYSIS, COMPLETE (UACMP) WITH MICROSCOPIC
Bacteria, UA: NONE SEEN
Bilirubin Urine: NEGATIVE
Glucose, UA: NEGATIVE mg/dL
Hgb urine dipstick: NEGATIVE
Ketones, ur: 20 mg/dL — AB
Leukocytes,Ua: NEGATIVE
Nitrite: NEGATIVE
Protein, ur: 30 mg/dL — AB
Specific Gravity, Urine: 1.019 (ref 1.005–1.030)
pH: 5 (ref 5.0–8.0)

## 2019-06-09 MED ORDER — SODIUM CHLORIDE 0.9% FLUSH
3.0000 mL | Freq: Once | INTRAVENOUS | Status: AC
  Administered 2019-06-09: 3 mL via INTRAVENOUS

## 2019-06-09 MED ORDER — SUCRALFATE 1 GM/10ML PO SUSP: 1.0000 g | Freq: Four times a day (QID) | ORAL | 1 refills | Status: AC

## 2019-06-09 MED ORDER — ONDANSETRON HCL 4 MG/2ML IJ SOLN
INTRAMUSCULAR | Status: AC
  Filled 2019-06-09: qty 2

## 2019-06-09 MED ORDER — ONDANSETRON HCL 4 MG/2ML IJ SOLN
4.0000 mg | Freq: Once | INTRAMUSCULAR | Status: AC | PRN
  Administered 2019-06-09: 4 mg via INTRAVENOUS

## 2019-06-09 MED ORDER — MORPHINE SULFATE (PF) 4 MG/ML IV SOLN
4.0000 mg | Freq: Once | INTRAVENOUS | Status: AC
  Administered 2019-06-09: 4 mg via INTRAVENOUS

## 2019-06-09 MED ORDER — HYDROCODONE-ACETAMINOPHEN 5-325 MG PO TABS: 1.0000 | ORAL_TABLET | ORAL | 0 refills | Status: DC | PRN

## 2019-06-09 MED ORDER — ONDANSETRON HCL 4 MG/2ML IJ SOLN
4.0000 mg | Freq: Once | INTRAMUSCULAR | Status: DC
  Filled 2019-06-09: qty 2

## 2019-06-09 MED ORDER — SODIUM CHLORIDE 0.9 % IV BOLUS
1000.0000 mL | Freq: Once | INTRAVENOUS | Status: AC
  Administered 2019-06-09: 1000 mL via INTRAVENOUS

## 2019-06-09 MED ORDER — MORPHINE SULFATE (PF) 4 MG/ML IV SOLN
INTRAVENOUS | Status: AC
  Administered 2019-06-09: 4 mg via INTRAVENOUS
  Filled 2019-06-09: qty 1

## 2019-06-09 MED ORDER — MORPHINE SULFATE (PF) 4 MG/ML IV SOLN
4.0000 mg | Freq: Once | INTRAVENOUS | Status: AC
  Administered 2019-06-09: 11:00:00 4 mg via INTRAVENOUS
  Filled 2019-06-09: qty 1

## 2019-06-09 NOTE — ED Provider Notes (Signed)
[employer] calls back.  Patient says that liquid Carafate is too expensive.  We will change it to pills and have her chew them first.  Additionally she was apparently promised some Zofran and that prescription did not turn up so we will give her Zofran ODT 4 mg 3 times a day for total of 12 wafers.   Nena Polio, MD 06/09/19 1227

## 2019-06-09 NOTE — ED Triage Notes (Signed)
Pt here with c/o epigastric pain, nausea and vomiting, states she can't keep anything down, appears in NAD.

## 2019-06-09 NOTE — ED Provider Notes (Signed)
[facility] Emergency Department Provider Note  Time seen: 9:07 AM  I have reviewed the triage vital signs and the nursing notes.   HISTORY  Chief Complaint Emesis and Abdominal Pain   HPI Michele Carroll is a 58 y.o. female with a past medical history of anxiety, hypertension, presents to the emergency department for upper abdominal discomfort nausea vomiting.  According to the patient over the past 4 days she has had persistent nausea vomiting and upper abdominal discomfort somewhat worse in the right upper quadrant.  Patient states she still has her gallbladder.  Patient also admits to being a daily alcohol drinker but is not drinking in the past 3 to 4 days due to the nausea and vomiting.  Patient states earlier this year she was diagnosed with gastritis that felt very similar to today's presentation.  States abdominal pain is actually somewhat relieved when she attempts to eat.   Denies any fever cough congestion or shortness of breath.  Past Medical History:  Diagnosis Date  . Anxiety   . Depression   . Hypertension     Patient Active Problem List   Diagnosis Date Noted  . Depressive disorder 06/05/2019  . Cigarette smoker 05/09/2019  . Essential hypertension 05/09/2019  . Swelling of both lower extremities 05/09/2019  . Anxiety 05/09/2019  . Migraine without aura and without status migrainosus, not intractable 05/09/2019  . Chronic obstructive pulmonary disease ([place]) 05/09/2019    Past Surgical History:  Procedure Laterality Date  . ABDOMINAL HYSTERECTOMY  2010  . [place]  . TONSILLECTOMY  1970    Prior to Admission medications   Medication Sig Start Date End Date Taking? Authorizing Provider  albuterol (VENTOLIN HFA) 108 (90 Base) MCG/ACT inhaler Inhale 2 puffs into the lungs every 6 (six) hours as needed for wheezing or shortness of breath. 05/08/19   Guse, Jacquelynn Cree, FNP  ALPRAZolam Duanne Moron) 0.5 MG tablet Take 1 tablet (0.5  mg total) by mouth at bedtime as needed for anxiety. 05/08/19   Jodelle Green, FNP  atorvastatin (LIPITOR) 10 MG tablet Take 1 tablet (10 mg total) by mouth daily. 05/13/19   Jodelle Green, FNP  butalbital-acetaminophen-caffeine (FIORICET) [phone] MG tablet Take 2 tablets by mouth 2 (two) times daily as needed for headache or migraine. 05/08/19   Jodelle Green, FNP  DULoxetine (CYMBALTA) 30 MG capsule Take 1 capsule (30 mg total) by mouth daily. 05/08/19   Guse, Jacquelynn Cree, FNP  hydrochlorothiazide (HYDRODIURIL) 25 MG tablet Take 1 tablet (25 mg total) by mouth daily. 05/08/19   Guse, Jacquelynn Cree, FNP  lisinopril (ZESTRIL) 20 MG tablet Take 1 tablet (20 mg total) by mouth daily. 05/08/19   Guse, Jacquelynn Cree, FNP  ondansetron (ZOFRAN ODT) 4 MG disintegrating tablet Take 1 tablet (4 mg total) by mouth every 8 (eight) hours as needed for nausea or vomiting. 05/08/19   Jodelle Green, FNP    Allergies  Allergen Reactions  . Shellfish Allergy     Family History  Problem Relation Age of Onset  . Cancer Mother   . Cancer Father     Social History Social History   Tobacco Use  . Smoking status: Current Every Day Smoker  . Smokeless tobacco: Never Used  Substance Use Topics  . Alcohol use: Yes  . Drug use: Yes    Comment: marijuana    Review of Systems Constitutional: Negative for fever. ENT: Negative for recent illness/congestion Cardiovascular: Negative for chest pain.  Respiratory: Negative for shortness of breath. Gastrointestinal: Mild upper abdominal discomfort more in the right upper quadrant and epigastrium.  Positive for nausea vomiting.  Negative for diarrhea.  Negative for black or bloody stool. Genitourinary: Decreased urine output per patient.  No dysuria. Musculoskeletal: Negative for musculoskeletal complaints Skin: Negative for skin complaints  Neurological: Negative for headache All other ROS negative  ____________________________________________   PHYSICAL EXAM:  VITAL  SIGNS: ED Triage Vitals  Enc Vitals Group     BP 06/09/19 0821 (!) 150/96     Pulse Rate 06/09/19 0821 (!) 106     Resp 06/09/19 0821 18     Temp 06/09/19 0821 98.1 F (36.7 C)     Temp Source 06/09/19 0821 Oral     SpO2 06/09/19 0821 98 %     Weight 06/09/19 0822 215 lb (97.5 kg)     Height 06/09/19 0822 5\' 5"  (1.651 m)     Head Circumference --      Peak Flow --      Pain Score 06/09/19 0834 9     Pain Loc --      Pain Edu? --      Excl. in GC? --    Constitutional: Alert and oriented. Well appearing and in no distress. Eyes: Normal exam ENT      Head: Normocephalic and atraumatic.      Mouth/Throat: Mucous membranes are moist. Cardiovascular: Normal rate, regular rhythm.  Respiratory: Normal respiratory effort without tachypnea nor retractions. Breath sounds are clear Gastrointestinal: Soft and nontender. No distention.  Musculoskeletal: Nontender with normal range of motion in all extremities.  Neurologic:  Normal speech and language. No gross focal neurologic deficits  Skin:  Skin is warm, dry and intact.  Psychiatric: Mood and affect are normal.  ____________________________________________    EKG  EKG viewed and interpreted by myself shows a sinus tachycardia 106 bpm with a narrow QRS, normal axis, normal intervals, no concerning ST changes.  ____________________________________________    RADIOLOGY  Ultrasound shows hepatic steatosis otherwise negative.  ____________________________________________   INITIAL IMPRESSION / ASSESSMENT AND PLAN / ED COURSE  Pertinent labs & imaging results that were available during my care of the patient were reviewed by me and considered in my medical decision making (see chart for details).   Patient presents to the emergency department for nausea vomiting and upper abdominal discomfort over the past 4 days.  Patient is a daily alcohol drinker.  Differential would include pancreatitis, gastritis, gastric or peptic ulcer  disease, gallbladder pathology, hepatic steatosis.  Patient's lab work is largely at her baseline with slight LFT elevation likely due to chronic alcoholism.  Given the patient's somewhat increased pain in the right upper quadrant we will obtain ultrasound to evaluate.  We will dose fluids pain and nausea medication.  If the patient's ultrasound is normal anticipate likely discharge home with gastritis treatment and GI follow-up.  Patient currently takes Prilosec on a daily basis.  Patient's ultrasound is negative for gallbladder pathology.  Highly suspect gastritis versus peptic ulcer disease.  We will discharge on sucralfate, patient will continue taking Prilosec and I will refer to GI medicine.  Patient agreeable to plan of care.  Gustavus MessingJoann Ganaway was evaluated in Emergency Department on 06/09/2019 for the symptoms described in the history of present illness. She was evaluated in the context of the global COVID-19 pandemic, which necessitated consideration that the patient might be at risk for infection with the SARS-CoV-2 virus that causes COVID-19. Institutional protocols  and algorithms that pertain to the evaluation of patients at risk for COVID-19 are in a state of rapid change based on information released by regulatory bodies including the CDC and federal and state organizations. These policies and algorithms were followed during the patient's care in the ED.  ____________________________________________   FINAL CLINICAL IMPRESSION(S) / ED DIAGNOSES  Nausea vomiting Upper abdominal pain Gastritis   Minna AntisPaduchowski, [place], MD 06/09/19 1026

## 2019-06-26 ENCOUNTER — Other Ambulatory Visit: Payer: Self-pay

## 2019-06-26 ENCOUNTER — Ambulatory Visit (INDEPENDENT_AMBULATORY_CARE_PROVIDER_SITE_OTHER): Payer: Self-pay | Admitting: Family Medicine

## 2019-06-26 DIAGNOSIS — F17210 Nicotine dependence, cigarettes, uncomplicated: Secondary | ICD-10-CM

## 2019-06-26 DIAGNOSIS — F419 Anxiety disorder, unspecified: Secondary | ICD-10-CM

## 2019-06-26 DIAGNOSIS — I10 Essential (primary) hypertension: Secondary | ICD-10-CM

## 2019-06-26 DIAGNOSIS — F329 Major depressive disorder, single episode, unspecified: Secondary | ICD-10-CM

## 2019-06-26 DIAGNOSIS — F32A Depression, unspecified: Secondary | ICD-10-CM

## 2019-06-26 DIAGNOSIS — G43009 Migraine without aura, not intractable, without status migrainosus: Secondary | ICD-10-CM

## 2019-06-26 MED ORDER — ONDANSETRON 4 MG PO TBDP: 4.0000 mg | ORAL_TABLET | Freq: Three times a day (TID) | ORAL | 2 refills | Status: AC | PRN

## 2019-06-26 MED ORDER — BUTALBITAL-APAP-CAFFEINE 50-325-40 MG PO TABS: 2.0000 | ORAL_TABLET | Freq: Two times a day (BID) | ORAL | 1 refills | Status: DC | PRN

## 2019-06-26 NOTE — Progress Notes (Signed)
Patient ID: Michele Carroll, female   DOB: 02/03/1961, 58 y.o.   MRN: [id number]    Virtual Visit via phone Note  This visit type was conducted due to national recommendations for restrictions regarding the COVID-19 pandemic (e.g. social distancing).  This format is felt to be most appropriate for this patient at this time.  All issues noted in this document were discussed and addressed.  No physical exam was performed (except for noted visual exam findings with Video Visits).   I connected with Michele Carroll today at  1:00 PM EDT by telephone and verified that I am speaking with the correct person using two identifiers. Location patient: home Location provider: work or home office Persons participating in the virtual visit: patient, provider  I discussed the limitations, risks, security and privacy concerns of performing an evaluation and management service by telephone and the availability of in person appointments. I also discussed with the patient that there may be a patient responsible charge related to this service. The patient expressed understanding and agreed to proceed.  HPI:  Patient and I connected via telephone to follow-up on mood after increasing Cymbalta from 30 mg to 60 mg daily.  Does feel it is helping somewhat, but not quite enough.  Still has times where she will cry, but episodes of crying and feeling extremely stressed and overwhelmed are less.  Denies any SI or HI.  Patient also to go to the emergency room on 06/09/2019 due to nausea and vomiting.  States she had a migraine and it was not helped by taking her Fioricet or Zofran and she ended up going to the ER due to vomiting not improving.  She was diagnosed with gastritis and given IV fluids.  Patient also had been drinking a little bit more over 4 July holiday weekend and also was hoping it would help her headache, so this did not make the nausea vomiting any better.  Patient has decided to cut back on her drinking, but  states she is only having a glass of wine on occasion now and realizes that she cannot overdo it.  Patient checked her blood pressure with a friend's cuff and states it was in the 150s over 80s.  She has been tolerating her lisinopril and hydrochlorothiazide without problems.  We will bring her into office for nurse visit to double check that blood pressure.  Otherwise she is doing well.  Felt much better after leaving ER and has not had any more breakthrough migraines.  Would like refill of her Fioricet and Zofran to have on hand if migraine does occur.  Denies any chest pain, palpitations, shortness breath or wheezing.  Denies any GI or GU complaints.  Denies fever or chills.   ROS: See pertinent positives and negatives per HPI.  Past Medical History:  Diagnosis Date  . Anxiety   . Depression   . Hypertension     Past Surgical History:  Procedure Laterality Date  . ABDOMINAL HYSTERECTOMY  2010  . CESAREAN SECTION  1986, 1996  . TONSILLECTOMY  1970    Family History  Problem Relation Age of Onset  . Cancer Mother   . Cancer Father     Social History   Tobacco Use  . Smoking status: Current Every Day Smoker  . Smokeless tobacco: Never Used  Substance Use Topics  . Alcohol use: Yes    Current Outpatient Medications:  .  albuterol (VENTOLIN HFA) 108 (90 Base) MCG/ACT inhaler, Inhale 2 puffs into the lungs  every 6 (six) hours as needed for wheezing or shortness of breath., Disp: 1 Inhaler, Rfl: 0 .  ALPRAZolam (XANAX) 0.5 MG tablet, Take 1 tablet (0.5 mg total) by mouth at bedtime as needed for anxiety., Disp: 30 tablet, Rfl: 0 .  atorvastatin (LIPITOR) 10 MG tablet, Take 1 tablet (10 mg total) by mouth daily., Disp: 90 tablet, Rfl: 3 .  butalbital-acetaminophen-caffeine (FIORICET) 50-325-40 MG tablet, Take 2 tablets by mouth 2 (two) times daily as needed for headache or migraine., Disp: 30 tablet, Rfl: 0 .  DULoxetine (CYMBALTA) 30 MG capsule, Take 1 capsule (30 mg total) by  mouth daily., Disp: 301 capsule, Rfl: 1 .  hydrochlorothiazide (HYDRODIURIL) 25 MG tablet, Take 1 tablet (25 mg total) by mouth daily., Disp: 90 tablet, Rfl: 1 .  HYDROcodone-acetaminophen (NORCO/VICODIN) 5-325 MG tablet, Take 1 tablet by mouth every 4 (four) hours as needed., Disp: 8 tablet, Rfl: 0 .  lisinopril (ZESTRIL) 20 MG tablet, Take 1 tablet (20 mg total) by mouth daily., Disp: 90 tablet, Rfl: 1 .  ondansetron (ZOFRAN ODT) 4 MG disintegrating tablet, Take 1 tablet (4 mg total) by mouth every 8 (eight) hours as needed for nausea or vomiting., Disp: 20 tablet, Rfl: 0 .  sucralfate (CARAFATE) 1 GM/10ML suspension, Take 10 mLs (1 g total) by mouth 4 (four) times daily., Disp: 420 mL, Rfl: 1  EXAM:  GENERAL: alert, oriented, appears well and in no acute distress  HEENT: atraumatic, conjunttiva clear, no obvious abnormalities on inspection of external nose and ears  NECK: normal movements of the head and neck  LUNGS: on inspection no signs of respiratory distress, breathing rate appears normal, no obvious gross SOB, gasping or wheezing  CV: no obvious cyanosis  MS: moves all visible extremities without noticeable abnormality  PSYCH/NEURO: pleasant and cooperative, no obvious depression or anxiety, speech and thought processing grossly intact  ASSESSMENT AND PLAN:  Discussed the following assessment and plan:  Migraine-patient will use Fioricet as needed for breakthrough migraine.  Encourage patient to keep herself well-hydrated, get proper sleep and eat a balanced diet.  Also advised patient to keep a notebook of any migraine triggers.  Hypertension-we will have patient come in to clinic for blood pressure check.  She will continue lisinopril and hydrochlorothiazide at this time we will make any medication adjustments as needed.  Anxiety and depression-mood is somewhat improved with increase of Cymbalta however not quite where patient wants to be.  Discussed different options of  weaning off of the Cymbalta and trying different medication or bumping up the Cymbalta dose.  Patient has had success with Cymbalta in the past though she would like to try higher dose.  We will increase to 90 mg total daily and see how this goes.  Cigarette smoker-greater than 3 minutes spent discussing smoking cessation with patient.  At this time she is not ready to quit will try working on smoking fewer cigarettes per day.   I discussed the assessment and treatment plan with the patient. The patient was provided an opportunity to ask questions and all were answered. The patient agreed with the plan and demonstrated an understanding of the instructions.   The patient was advised to call back or seek an in-person evaluation if the symptoms worsen or if the condition fails to improve as anticipated.  I provided 15 minutes of non-face-to-face time during this encounter.   Jodelle Green, FNP

## 2019-07-01 ENCOUNTER — Ambulatory Visit: Payer: Self-pay

## 2019-07-03 ENCOUNTER — Telehealth: Payer: Self-pay | Admitting: Family Medicine

## 2019-07-03 DIAGNOSIS — F419 Anxiety disorder, unspecified: Secondary | ICD-10-CM

## 2019-07-03 NOTE — Telephone Encounter (Signed)
Medication Refill - Medication: ALPRAZolam (XANAX) 0.5 MG tablet   Has the patient contacted their pharmacy? Yes (Agent: If no, request that the patient contact the pharmacy for the refill.) (Agent: If yes, when and what did the pharmacy advise?)Contact PCP  Preferred Pharmacy (with phone number or street name):  [facility], [place] [phone] (Phone) [phone] (Fax)     Agent: Please be advised that RX refills may take up to 3 business days. We ask that you follow-up with your pharmacy.

## 2019-07-03 NOTE — Telephone Encounter (Signed)
Last Refill:  05/08/19  Last OV:  06/26/19

## 2019-07-04 ENCOUNTER — Other Ambulatory Visit: Payer: Self-pay

## 2019-07-04 ENCOUNTER — Emergency Department
Admission: EM | Admit: 2019-07-04 | Discharge: 2019-07-04 | Disposition: A | Discharge status: Home / Self Care | Payer: Self-pay | Attending: Emergency Medicine | Admitting: Emergency Medicine

## 2019-07-04 ENCOUNTER — Emergency Department: Payer: Self-pay

## 2019-07-04 ENCOUNTER — Encounter: Payer: Self-pay | Admitting: Emergency Medicine

## 2019-07-04 DIAGNOSIS — I10 Essential (primary) hypertension: Secondary | ICD-10-CM | POA: Insufficient documentation

## 2019-07-04 DIAGNOSIS — F17210 Nicotine dependence, cigarettes, uncomplicated: Secondary | ICD-10-CM | POA: Insufficient documentation

## 2019-07-04 DIAGNOSIS — R112 Nausea with vomiting, unspecified: Secondary | ICD-10-CM | POA: Insufficient documentation

## 2019-07-04 DIAGNOSIS — Z79899 Other long term (current) drug therapy: Secondary | ICD-10-CM | POA: Insufficient documentation

## 2019-07-04 DIAGNOSIS — R1013 Epigastric pain: Secondary | ICD-10-CM | POA: Insufficient documentation

## 2019-07-04 LAB — URINALYSIS, COMPLETE (UACMP) WITH MICROSCOPIC
Bacteria, UA: NONE SEEN
Bilirubin Urine: NEGATIVE
Glucose, UA: NEGATIVE mg/dL
Ketones, ur: 20 mg/dL — AB
Leukocytes,Ua: NEGATIVE
Nitrite: NEGATIVE
Protein, ur: 30 mg/dL — AB
Specific Gravity, Urine: >1.046 — ABNORMAL HIGH (ref 1.005–1.030)
pH: 6 (ref 5.0–8.0)

## 2019-07-04 LAB — CBC
HCT: 43.5 % (ref 36.0–46.0)
Hemoglobin: 15.1 g/dL — ABNORMAL HIGH (ref 12.0–15.0)
MCH: 33.8 pg (ref 26.0–34.0)
MCHC: 34.7 g/dL (ref 30.0–36.0)
MCV: 97.3 fL (ref 80.0–100.0)
Platelets: 343 10*3/uL (ref 150–400)
RBC: 4.47 MIL/uL (ref 3.87–5.11)
RDW: 13.9 % (ref 11.5–15.5)
WBC: 13.8 10*3/uL — ABNORMAL HIGH (ref 4.0–10.5)
nRBC: 0 % (ref 0.0–0.2)

## 2019-07-04 LAB — COMPREHENSIVE METABOLIC PANEL
ALT: 46 U/L — ABNORMAL HIGH (ref 0–44)
AST: 50 U/L — ABNORMAL HIGH (ref 15–41)
Albumin: 4.6 g/dL (ref 3.5–5.0)
Alkaline Phosphatase: 60 U/L (ref 38–126)
Anion gap: 15 (ref 5–15)
BUN: 15 mg/dL (ref 6–20)
CO2: 23 mmol/L (ref 22–32)
Calcium: 10.2 mg/dL (ref 8.9–10.3)
Chloride: 99 mmol/L (ref 98–111)
Creatinine, Ser: 1.15 mg/dL — ABNORMAL HIGH (ref 0.44–1.00)
GFR calc Af Amer: >60 mL/min (ref >60)
GFR calc non Af Amer: 53 mL/min — ABNORMAL LOW (ref >60)
Glucose, Bld: 172 mg/dL — ABNORMAL HIGH (ref 70–99)
Potassium: 3.1 mmol/L — ABNORMAL LOW (ref 3.5–5.1)
Sodium: 137 mmol/L (ref 135–145)
Total Bilirubin: 1 mg/dL (ref 0.3–1.2)
Total Protein: 8.3 g/dL — ABNORMAL HIGH (ref 6.5–8.1)

## 2019-07-04 LAB — TROPONIN I (HIGH SENSITIVITY): Troponin I (High Sensitivity): 6 ng/L (ref <18)

## 2019-07-04 LAB — LIPASE, BLOOD: Lipase: 33 U/L (ref 11–51)

## 2019-07-04 MED ORDER — LORAZEPAM 2 MG/ML IJ SOLN
1.0000 mg | Freq: Once | INTRAMUSCULAR | Status: AC
  Administered 2019-07-04: 1 mg via INTRAVENOUS
  Filled 2019-07-04: qty 1

## 2019-07-04 MED ORDER — LIDOCAINE VISCOUS HCL 2 % MT SOLN
15.0000 mL | Freq: Once | OROMUCOSAL | Status: AC
  Administered 2019-07-04: 15 mL via ORAL
  Filled 2019-07-04: qty 15

## 2019-07-04 MED ORDER — ALPRAZOLAM 0.5 MG PO TABS: 0.5000 mg | ORAL_TABLET | Freq: Every evening | ORAL | 0 refills | Status: DC | PRN

## 2019-07-04 MED ORDER — MORPHINE SULFATE (PF) 4 MG/ML IV SOLN
4.0000 mg | INTRAVENOUS | Status: DC | PRN
  Administered 2019-07-04: 4 mg via INTRAVENOUS
  Filled 2019-07-04: qty 1

## 2019-07-04 MED ORDER — PANTOPRAZOLE SODIUM 20 MG PO TBEC: 20.0000 mg | DELAYED_RELEASE_TABLET | Freq: Every day | ORAL | 1 refills | Status: AC

## 2019-07-04 MED ORDER — FENTANYL CITRATE (PF) 100 MCG/2ML IJ SOLN
50.0000 ug | INTRAMUSCULAR | Status: DC | PRN
  Administered 2019-07-04: 50 ug via INTRAVENOUS
  Filled 2019-07-04: qty 2

## 2019-07-04 MED ORDER — ALUM & MAG HYDROXIDE-SIMETH 200-200-20 MG/5ML PO SUSP
30.0000 mL | Freq: Once | ORAL | Status: AC
  Administered 2019-07-04: 30 mL via ORAL
  Filled 2019-07-04: qty 30

## 2019-07-04 MED ORDER — METOCLOPRAMIDE HCL 5 MG/ML IJ SOLN
10.0000 mg | Freq: Once | INTRAMUSCULAR | Status: AC
  Administered 2019-07-04: 10 mg via INTRAVENOUS
  Filled 2019-07-04: qty 2

## 2019-07-04 MED ORDER — HALOPERIDOL LACTATE 5 MG/ML IJ SOLN
5.0000 mg | Freq: Once | INTRAMUSCULAR | Status: AC
  Administered 2019-07-04: 5 mg via INTRAVENOUS
  Filled 2019-07-04: qty 1

## 2019-07-04 MED ORDER — SODIUM CHLORIDE 0.9 % IV BOLUS
1000.0000 mL | Freq: Once | INTRAVENOUS | Status: AC
  Administered 2019-07-04: 19:00:00 1000 mL via INTRAVENOUS

## 2019-07-04 MED ORDER — ONDANSETRON HCL 4 MG/2ML IJ SOLN
4.0000 mg | Freq: Once | INTRAMUSCULAR | Status: AC | PRN
  Administered 2019-07-04: 4 mg via INTRAVENOUS
  Filled 2019-07-04: qty 2

## 2019-07-04 MED ORDER — PROMETHAZINE HCL 12.5 MG PO TABS: 12.5000 mg | ORAL_TABLET | Freq: Four times a day (QID) | ORAL | 0 refills | Status: AC | PRN

## 2019-07-04 MED ORDER — SODIUM CHLORIDE 0.9 % IV BOLUS: 1000.0000 mL | Freq: Once | INTRAVENOUS | Status: DC

## 2019-07-04 MED ORDER — PANTOPRAZOLE SODIUM 40 MG PO TBEC
40.0000 mg | DELAYED_RELEASE_TABLET | Freq: Once | ORAL | Status: AC
  Administered 2019-07-04: 40 mg via ORAL
  Filled 2019-07-04: qty 1

## 2019-07-04 MED ORDER — IOHEXOL 300 MG/ML  SOLN
100.0000 mL | Freq: Once | INTRAMUSCULAR | Status: AC | PRN
  Administered 2019-07-04: 100 mL via INTRAVENOUS

## 2019-07-04 MED ORDER — ONDANSETRON HCL 4 MG/2ML IJ SOLN
4.0000 mg | Freq: Once | INTRAMUSCULAR | Status: AC
  Administered 2019-07-04: 21:00:00 4 mg via INTRAVENOUS
  Filled 2019-07-04: qty 2

## 2019-07-04 NOTE — ED Provider Notes (Signed)
[facility] Emergency Department Provider Note    First MD Initiated Contact with Patient 07/04/19 1820     (approximate)  I have reviewed the triage vital signs and the nursing notes.   HISTORY  Chief Complaint Abdominal Pain and Emesis    HPI Michele Carroll is a 58 y.o. female below listed past medical history presents the ER for evaluation of epigastric discomfort nausea and vomiting for the past 3 days.  States that she is starting to feel better but last night tried to eat something else and then started feeling sick on her stomach again.  States that the pain is primarily epigastric region.  No radiation through to her back.  It is tender and she feels bloated.  Denies any abdominal surgeries.  Feels dehydrated.  No fevers.  No known sick contacts.    Past Medical History:  Diagnosis Date  . Anxiety   . Depression   . Hypertension    Family History  Problem Relation Age of Onset  . Cancer Mother   . Cancer Father    Past Surgical History:  Procedure Laterality Date  . ABDOMINAL HYSTERECTOMY  2010  . [place]  . TONSILLECTOMY  1970   Patient Active Problem List   Diagnosis Date Noted  . Depressive disorder 06/05/2019  . Cigarette smoker 05/09/2019  . Essential hypertension 05/09/2019  . Swelling of both lower extremities 05/09/2019  . Anxiety 05/09/2019  . Migraine without aura and without status migrainosus, not intractable 05/09/2019  . Chronic obstructive pulmonary disease ([place]) 05/09/2019      Prior to Admission medications   Medication Sig Start Date End Date Taking? Authorizing Provider  albuterol (VENTOLIN HFA) 108 (90 Base) MCG/ACT inhaler Inhale 2 puffs into the lungs every 6 (six) hours as needed for wheezing or shortness of breath. 05/08/19  Yes Guse, Jacquelynn Cree, FNP  ALPRAZolam Duanne Moron) 0.5 MG tablet Take 1 tablet (0.5 mg total) by mouth at bedtime as needed for anxiety. 07/04/19  Yes Guse, Jacquelynn Cree, FNP   atorvastatin (LIPITOR) 10 MG tablet Take 1 tablet (10 mg total) by mouth daily. 05/13/19  Yes Guse, Jacquelynn Cree, FNP  butalbital-acetaminophen-caffeine (FIORICET) 50-325-40 MG tablet Take 2 tablets by mouth 2 (two) times daily as needed for headache or migraine. 06/26/19  Yes Guse, Jacquelynn Cree, FNP  DULoxetine (CYMBALTA) 30 MG capsule Take 1 capsule (30 mg total) by mouth daily. Patient taking differently: Take 90 mg by mouth daily.  05/08/19  Yes Guse, Jacquelynn Cree, FNP  hydrochlorothiazide (HYDRODIURIL) 25 MG tablet Take 1 tablet (25 mg total) by mouth daily. 05/08/19  Yes Guse, Jacquelynn Cree, FNP  lisinopril (ZESTRIL) 20 MG tablet Take 1 tablet (20 mg total) by mouth daily. 05/08/19  Yes Guse, Jacquelynn Cree, FNP  ondansetron (ZOFRAN ODT) 4 MG disintegrating tablet Take 1 tablet (4 mg total) by mouth every 8 (eight) hours as needed for nausea or vomiting. 06/26/19  Yes Guse, Jacquelynn Cree, FNP  sucralfate (CARAFATE) 1 GM/10ML suspension Take 10 mLs (1 g total) by mouth 4 (four) times daily. Patient taking differently: Take 1 g by mouth 2 (two) times a day.  06/09/19 06/08/20 Yes Harvest Dark, MD  umeclidinium-vilanterol (ANORO ELLIPTA) 62.5-25 MCG/INH AEPB Inhale 1 puff into the lungs daily.   Yes [provider]    Allergies Shellfish allergy    Social History Social History   Tobacco Use  . Smoking status: Current Every Day Smoker  . Smokeless tobacco: Never  Used  Substance Use Topics  . Alcohol use: Yes  . Drug use: Yes    Comment: marijuana    Review of Systems Patient denies headaches, rhinorrhea, blurry vision, numbness, shortness of breath, chest pain, edema, cough, abdominal pain, nausea, vomiting, diarrhea, dysuria, fevers, rashes or hallucinations unless otherwise stated above in HPI. ____________________________________________   PHYSICAL EXAM:  VITAL SIGNS: Vitals:   07/04/19 1322 07/04/19 1939  BP: (!) 174/106 (!) 191/127  Pulse:  (!) 102  Resp:  (!) 24  Temp:    SpO2:  98%     Constitutional: Alert and oriented.  Eyes: Conjunctivae are normal.  Head: Atraumatic. Nose: No congestion/rhinnorhea. Mouth/Throat: Mucous membranes are moist.   Neck: No stridor. Painless ROM.  Cardiovascular: Normal rate, regular rhythm. Grossly normal heart sounds.  Good peripheral circulation. Respiratory: Normal respiratory effort.  No retractions. Lungs CTAB. Gastrointestinal: Soft and nontender. No distention. No abdominal bruits. No CVA tenderness. Genitourinary:  Musculoskeletal: No lower extremity tenderness nor edema.  No joint effusions. Neurologic:  Normal speech and language. No gross focal neurologic deficits are appreciated. No facial droop Skin:  Skin is warm, dry and intact. No rash noted. Psychiatric: Mood and affect are normal. Speech and behavior are normal.  ____________________________________________   LABS (all labs ordered are listed, but only abnormal results are displayed)  Results for orders placed or performed during the hospital encounter of 07/04/19 (from the past 24 hour(s))  Lipase, blood     Status: None   Collection Time: 07/04/19  1:23 PM  Result Value Ref Range   Lipase 33 11 - 51 U/L  Comprehensive metabolic panel     Status: Abnormal   Collection Time: 07/04/19  1:23 PM  Result Value Ref Range   Sodium 137 135 - 145 mmol/L   Potassium 3.1 (L) 3.5 - 5.1 mmol/L   Chloride 99 98 - 111 mmol/L   CO2 23 22 - 32 mmol/L   Glucose, Bld 172 (H) 70 - 99 mg/dL   BUN 15 6 - 20 mg/dL   Creatinine, Ser 7.821.15 (H) 0.44 - 1.00 mg/dL   Calcium 95.610.2 8.9 - 21.310.3 mg/dL   Total Protein 8.3 (H) 6.5 - 8.1 g/dL   Albumin 4.6 3.5 - 5.0 g/dL   AST 50 (H) 15 - 41 U/L   ALT 46 (H) 0 - 44 U/L   Alkaline Phosphatase 60 38 - 126 U/L   Total Bilirubin 1.0 0.3 - 1.2 mg/dL   GFR calc non Af Amer 53 (L) >60 mL/min   GFR calc Af Amer >60 >60 mL/min   Anion gap 15 5 - 15  CBC     Status: Abnormal   Collection Time: 07/04/19  1:23 PM  Result Value Ref Range   WBC 13.8  (H) 4.0 - 10.5 K/uL   RBC 4.47 3.87 - 5.11 MIL/uL   Hemoglobin 15.1 (H) 12.0 - 15.0 g/dL   HCT 08.643.5 57.836.0 - 46.946.0 %   MCV 97.3 80.0 - 100.0 fL   MCH 33.8 26.0 - 34.0 pg   MCHC 34.7 30.0 - 36.0 g/dL   RDW 62.913.9 52.811.5 - 41.315.5 %   Platelets 343 150 - 400 K/uL   nRBC 0.0 0.0 - 0.2 %  Troponin I (High Sensitivity)     Status: None   Collection Time: 07/04/19  1:23 PM  Result Value Ref Range   Troponin I (High Sensitivity) 6 <18 ng/L   ____________________________________________  EKG My review and personal interpretation at Time: 19:52  Indication: nausea and vomiting  Rate: 85  Rhythm: sinus Axis: normal Other: normal intervals, nonspecific st abnm ____________________________________________  RADIOLOGY  I personally reviewed all radiographic images ordered to evaluate for the above acute complaints and reviewed radiology reports and findings.  These findings were personally discussed with the patient.  Please see medical record for radiology report.  ____________________________________________   PROCEDURES  Procedure(s) performed:  Procedures    Critical Care performed: no ____________________________________________   INITIAL IMPRESSION / ASSESSMENT AND PLAN / ED COURSE  Pertinent labs & imaging results that were available during my care of the patient were reviewed by me and considered in my medical decision making (see chart for details).   DDX: Enteritis, gastritis, pancreatitis, colitis, diverticulosis, SBO, cholelithiasis, cholecystitis  Gustavus MessingJoann Wedekind is a 58 y.o. who presents to the ED with symptoms as described above.  Patient anxious appearing complaining of epigastric discomfort feeling very nauseated.  Will give fluids as well as IV pain medication as well as IV antiemetic.  Will give some IV fluids for dehydration.  Does have mild leukocytosis.  Given location of pain will order CT imaging to evaluate.  Have low suspicion for pneumonia ACS or thoracic pathology.   The patient will be placed on continuous pulse oximetry and telemetry for monitoring.  Laboratory evaluation will be sent to evaluate for the above complaints.     Clinical Course as of Jul 03 2038  Fri Jul 04, 2019  2036 Patient with improvement in symptoms.  Still having some odd nausea and feels very anxious therefore will give Ativan.  She is tachycardic and hypertensive.  Her troponin is negative.  EKG shows no evidence of ischemia.  Suspect some form of gastritis.    [PR]    Clinical Course User Index [PR] Willy Eddyobinson, Demeisha Geraghty, MD   Patient with signout oncoming physician pending reassessment completion of medical treatment to ensure that she is clinically improving.  Anticipate discharge home.   The patient was evaluated in Emergency Department today for the symptoms described in the history of present illness. He/she was evaluated in the context of the global COVID-19 pandemic, which necessitated consideration that the patient might be at risk for infection with the SARS-CoV-2 virus that causes COVID-19. Institutional protocols and algorithms that pertain to the evaluation of patients at risk for COVID-19 are in a state of rapid change based on information released by regulatory bodies including the CDC and federal and state organizations. These policies and algorithms were followed during the patient's care in the ED.  As part of my medical decision making, I reviewed the following data within the electronic MEDICAL RECORD NUMBER Nursing notes reviewed and incorporated, Labs reviewed, notes from prior ED visits and [place] Controlled Substance Database   ____________________________________________   FINAL CLINICAL IMPRESSION(S) / ED DIAGNOSES  Final diagnoses:  Epigastric pain  Non-intractable vomiting with nausea, unspecified vomiting type      NEW MEDICATIONS STARTED DURING THIS VISIT:  New Prescriptions   No medications on file     Note:  This document was prepared using Dragon  voice recognition software and may include unintentional dictation errors.    Willy Eddyobinson, Nathanyl Andujo, MD 07/04/19 2039

## 2019-07-04 NOTE — ED Notes (Signed)
Pt called out inquiring about wait time. Pt informed that she has 2 people ahead of her.

## 2019-07-04 NOTE — Discharge Instructions (Signed)

## 2019-07-04 NOTE — ED Provider Notes (Signed)
Assumed care from Dr. Quentin Cornwall. Briefly, 58 yo F here with n/v, dehydration. CT scan negative. No apparent infectious trigger. Abdomen soft, NT, ND. VSS. Plan to reassess after antiemetics, plan to d/c if tolerating PO.  Haldol given for ongoing n/v. Marked improvement noted. Tolerating PO and requesting d/c.    Michele Bruce, MD 07/05/19 1044

## 2019-07-04 NOTE — ED Triage Notes (Signed)
Pt c/o vomiting X 3 days and unable to keep anything down.  Reports was able to finally eat last night and felt a little better but when woke this morning it was back and worse.  Has been using Zofran and Carafate.  No diarrhea or fevers.  Has had bowel movement today. Pain is to upper mid abdomen and LUQ.  C/o bloating.  Took zofran oral without relief at 1000

## 2019-07-04 NOTE — Telephone Encounter (Signed)
PMP registry checked  RX filled

## 2019-07-04 NOTE — ED Notes (Signed)
Pt c/o severe abdominal pain. Feels like when here recently for gastritis but pain worse.  Pain protocol ordered.

## 2019-07-04 NOTE — ED Notes (Signed)
Reviewed discharge instructions, follow-up care, and prescriptions with patient. Patient verbalized understanding of all information reviewed. Patient stable, with no distress noted at this time.    

## 2019-08-12 ENCOUNTER — Other Ambulatory Visit: Payer: Self-pay | Admitting: Family Medicine

## 2019-08-12 DIAGNOSIS — F419 Anxiety disorder, unspecified: Secondary | ICD-10-CM

## 2019-08-25 ENCOUNTER — Ambulatory Visit: Payer: Self-pay | Admitting: *Deleted

## 2019-08-25 NOTE — Telephone Encounter (Signed)
Pt called in c/o vomiting hourly since Friday and having a migraine headache.  She has used her prescribed medications without relief especially for the vomiting.  I let her know she needed to go to the ED due to dehydration and for medications for the vomiting and migraine since her medications are not giving her relief.  She needs IV fluids.    "I don't have anything on my stomach".    "Nothing stays down".    "I've been going through this for years".     She mentioned she does not have insurance right now.   I let her know they will still treat her in the ED without insurance.    "I may try to go later tonight when it's not so busy".      I   Reason for Disposition . [1] Drinking very little AND [2] dehydration suspected (e.g., no urine > 12 hours, very dry mouth, very lightheaded)  Answer Assessment - Initial Assessment Questions 1. VOMITING SEVERITY: "How many times have you vomited in the past 24 hours?"     - MILD:  1 - 2 times/day    - MODERATE: 3 - 5 times/day, decreased oral intake without significant weight loss or symptoms of dehydration    - SEVERE: 6 or more times/day, vomits everything or nearly everything, with significant weight loss, symptoms of dehydration      I start with a headache.   I've been vomiting since Friday.   I don't have insurance right now.   I've had to go to the ED for IV fluids.   The Zofran is not working.  I need Phenergan.   I'm hourly vomiting.   I'm dry heaving. 2. ONSET: "When did the vomiting begin?"      Friday. 3. FLUIDS: "What fluids or food have you vomited up today?" "Have you been able to keep any fluids down?"     I have nothing on stomach.   I get migraines.  I use Fiorcet.   I use a nasal spray for the migraines also.   4. ABDOMINAL PAIN: "Are your having any abdominal pain?" If yes : "How bad is it and what does it feel like?" (e.g., crampy, dull, intermittent, constant)      No pain 5. DIARRHEA: "Is there any diarrhea?" If so, ask: "How  many times today?"      No 6. CONTACTS: "Is there anyone else in the family with the same symptoms?"      No 7. CAUSE: "What do you think is causing your vomiting?"     Migraine 8. HYDRATION STATUS: "Any signs of dehydration?" (e.g., dry mouth [not only dry lips], too weak to stand) "When did you last urinate?"     I'm peeing fine and my urine is not dark.  I've been dealing with this for years.   I can't afford to see the GI doctor right now without insurance. 9. OTHER SYMPTOMS: "Do you have any other symptoms?" (e.g., fever, headache, vertigo, vomiting blood or coffee grounds, recent head injury)     Migraine and vomiting.    I've vomited hourly since Friday. 10. PREGNANCY: "Is there any chance you are pregnant?" "When was your last menstrual period?"       Not asked.  Protocols used: [facility]

## 2019-08-29 ENCOUNTER — Ambulatory Visit (INDEPENDENT_AMBULATORY_CARE_PROVIDER_SITE_OTHER): Payer: Self-pay | Admitting: Family Medicine

## 2019-08-29 ENCOUNTER — Encounter: Payer: Self-pay | Admitting: Family Medicine

## 2019-08-29 ENCOUNTER — Other Ambulatory Visit: Payer: Self-pay

## 2019-08-29 ENCOUNTER — Telehealth: Payer: Self-pay

## 2019-08-29 VITALS — BP 138/78 | HR 102 | Temp 97.4°F | Resp 18 | Ht 63.5 in | Wt 211.0 lb

## 2019-08-29 DIAGNOSIS — F329 Major depressive disorder, single episode, unspecified: Secondary | ICD-10-CM

## 2019-08-29 DIAGNOSIS — E785 Hyperlipidemia, unspecified: Secondary | ICD-10-CM

## 2019-08-29 DIAGNOSIS — F32A Depression, unspecified: Secondary | ICD-10-CM

## 2019-08-29 DIAGNOSIS — I10 Essential (primary) hypertension: Secondary | ICD-10-CM

## 2019-08-29 DIAGNOSIS — J449 Chronic obstructive pulmonary disease, unspecified: Secondary | ICD-10-CM

## 2019-08-29 DIAGNOSIS — F17210 Nicotine dependence, cigarettes, uncomplicated: Secondary | ICD-10-CM

## 2019-08-29 DIAGNOSIS — F419 Anxiety disorder, unspecified: Secondary | ICD-10-CM

## 2019-08-29 DIAGNOSIS — G43009 Migraine without aura, not intractable, without status migrainosus: Secondary | ICD-10-CM

## 2019-08-29 MED ORDER — PROMETHAZINE HCL 12.5 MG RE SUPP: 12.5000 mg | Freq: Four times a day (QID) | RECTAL | 1 refills | Status: AC | PRN

## 2019-08-29 MED ORDER — DULOXETINE HCL 60 MG PO CPEP: 120.0000 mg | ORAL_CAPSULE | Freq: Every day | ORAL | 1 refills | Status: DC

## 2019-08-29 NOTE — Telephone Encounter (Signed)
Copied from [place] [id number]. Topic: Referral - Status >> Aug 29, 2019  1:28 PM Simone Curia D wrote: 7/86/7672 Unable to leave message voicemail not set-up. I will email information to patient and attempt to call again 09/01/2019. Ambrose Mantle [phone]

## 2019-08-29 NOTE — Progress Notes (Signed)
Subjective:    Patient ID: Michele Carroll, female    DOB: 05-21-1961, 58 y.o.   MRN: [id number]  HPI   Patient resents to clinic for follow-up on blood pressure, migraine, COPD, anxiety and depression.  BP has been well controlled with current medication regimen.  Tolerating without any problems.  Denies any chest pain, palpitations and lower extremity swelling has been stable, also elevating legs helps combat swelling.  Patient does get breakthrough migraines.  At times they can be bad enough to cause nausea and vomiting.  Uses Phenergan with decent success to calm the symptoms down.  Would like refill of Phenergan to have on hand.  Patient does have COPD.  Patient has been reluctant to see pulmonology due to concerns over cost because she is self-pay.  She is a cigarette smoker and has been for many years, gets breathless at times with having to walk longer distances will be up on her feet for extended time.  She has been taking her Norco and this medication does help her symptoms improved.  She is trying to smoke less, but smoking does help calm her anxiety.  Patient has a lot of stress and feeling down at times.  Lives with significant other, but things are not always good.  She has been unemployed throughout the pandemic, moved here from [place] right at the beginning of the pandemic.  Unsure how to navigate applying for any sort of Medicaid, we will help patient with that.  Cymbalta is helping somewhat with mood, but does not feel is helping as much as it used to in the past.  Is interested in trying a slightly increased dose.  Patient Active Problem List   Diagnosis Date Noted  . Depressive disorder 06/05/2019  . Cigarette smoker 05/09/2019  . Essential hypertension 05/09/2019  . Swelling of both lower extremities 05/09/2019  . Anxiety 05/09/2019  . Migraine without aura and without status migrainosus, not intractable 05/09/2019  . Chronic obstructive pulmonary disease ([facility])  05/09/2019   Social History   Tobacco Use  . Smoking status: Current Every Day Smoker  . Smokeless tobacco: Never Used  Substance Use Topics  . Alcohol use: Yes   Review of Systems  Constitutional: Negative for chills, fatigue and fever.  HENT: Negative for congestion, ear pain, sinus pain and sore throat.   Eyes: Negative.   Respiratory: Negative for cough, shortness of breath and wheezing.   Cardiovascular: Negative for chest pain, palpitations and leg swelling.  Gastrointestinal: Negative for abdominal pain, diarrhea, nausea and vomiting.  Genitourinary: Negative for dysuria, frequency and urgency.  Musculoskeletal: Negative for arthralgias and myalgias.  Skin: Negative for color change, pallor and rash.  Neurological: Negative for syncope, light-headedness and headaches.  Psychiatric/Behavioral: The patient is not nervous/anxious. Down at times.      Objective:   Physical Exam Vitals signs and nursing note reviewed.  Constitutional:      General: She is not in acute distress.    Appearance: She is not ill-appearing, toxic-appearing or diaphoretic.  HENT:     Head: Normocephalic and atraumatic.  Eyes:     General: No scleral icterus.    Extraocular Movements: Extraocular movements intact.     Conjunctiva/sclera: Conjunctivae normal.     Pupils: Pupils are equal, round, and reactive to light.  Cardiovascular:     Rate and Rhythm: Normal rate and regular rhythm.  Pulmonary:     Effort: Pulmonary effort is normal. No respiratory distress.     Breath sounds:  No stridor. Wheezing (scattered wheezes, diminished lung sounds.) present.  Chest:     Chest wall: No tenderness.  Musculoskeletal:     Comments: Trace LE edema bilat  Neurological:     General: No focal deficit present.     Mental Status: She is alert and oriented to person, place, and time.     Gait: Gait normal.  Psychiatric:        Mood and Affect: Mood normal.        Behavior: Behavior normal.     Today's Vitals   08/29/19 1122  BP: 138/78  Pulse: (!) 102  Resp: 18  Temp: (!) 97.4 F (36.3 C)  TempSrc: Temporal  SpO2: 94%  Weight: 211 lb (95.7 kg)  Height: 5' 3.5" (1.613 m)   Body mass index is 36.79 kg/m.     Assessment & Plan:   We will increase patient's Cymbalta to 120 mg/day to see how this goes.  She does have counselor she is seen, she will continue to do this. She will use Phenergan as needed to help calm nausea that comes with migraines. Patient given Anoro samples from office.  Offered to get a referral to pulmonology, will hold off at this time.  She is agreeable to [place] referring her to a care coordinator to hopefully help assist with getting insurance and other since then she may need.  Recommended slowly weaning down on the amount of cigarettes she needs to smoke per day.  Patient will slowly try to wean self down. Blood pressure well controlled at this time.  She will continue her medications. Tolerating statin with no issues for lipid control.   1. Anxiety  - [facility] to [facility] - DULoxetine (CYMBALTA) 60 MG capsule; Take 2 capsules (120 mg total) by mouth daily.  Dispense: 180 capsule; Refill: 1  2. Migraine without aura and without status migrainosus, not intractable  - Ambulatory referral to [facility] - promethazine (PHENERGAN) 12.5 MG suppository; Place 1 suppository (12.5 mg total) rectally every 6 (six) hours as needed for nausea or vomiting.  Dispense: 12 each; Refill: 1  3. Depressive disorder  - Ambulatory referral to [facility] - DULoxetine (CYMBALTA) 60 MG capsule; Take 2 capsules (120 mg total) by mouth daily.  Dispense: 180 capsule; Refill: 1  4. Essential hypertension  - Ambulatory referral to [facility]  5. Cigarette smoker  - Ambulatory referral to [facility]  6. Chronic obstructive pulmonary disease, unspecified COPD type ([place])  - Ambulatory referral to [facility]  7. Hyperlipidemia,  unspecified hyperlipidemia type  - Ambulatory referral to [facility]   She will follow up in 6-8 weeks for recheck on chronic conditions.

## 2019-09-02 ENCOUNTER — Telehealth: Payer: Self-pay | Admitting: Family Medicine

## 2019-09-02 ENCOUNTER — Telehealth: Payer: Self-pay

## 2019-09-02 DIAGNOSIS — I10 Essential (primary) hypertension: Secondary | ICD-10-CM

## 2019-09-02 DIAGNOSIS — M7989 Other specified soft tissue disorders: Secondary | ICD-10-CM

## 2019-09-02 NOTE — Telephone Encounter (Signed)
Medication Refill - Medication: lisinopril (ZESTRIL) 20 MG tablet  Has the patient contacted their pharmacy? yes (Agent: If no, request that the patient contact the pharmacy for the refill.) (Agent: If yes, when and what did the pharmacy advise?)  Preferred Pharmacy (with phone number or street name):  [place], [place] [phone] (Phone) [phone] (Fax)   Agent: Please be advised that RX refills may take up to 3 business days. We ask that you follow-up with your pharmacy.

## 2019-09-02 NOTE — Telephone Encounter (Signed)
Copied from [place] [phone]. Topic: Referral - Status >> Sep 02, 2019 32:91 AM Simone Curia D wrote: 08/20/6059 Unable to leave message voicemail not set-up. I will email information to patient and attempt to call again 09/03/2019. Ambrose Mantle [phone]

## 2019-09-05 ENCOUNTER — Telehealth: Payer: Self-pay

## 2019-09-05 NOTE — Telephone Encounter (Signed)
Copied from [place] [phone]. Topic: Referral - Status >> Sep 05, 2019  0:22 PM Simone Curia D wrote: 33/05/1223 Unable to leave message voicemail is full. Will attempt to call again next week. Ambrose Mantle [phone]

## 2019-09-08 NOTE — Telephone Encounter (Signed)
Patient called to check status of refill on lisinopril (ZESTRIL) 20 MG tablet say that she is in need of this medication ordered on 09/04/2019

## 2019-09-10 ENCOUNTER — Other Ambulatory Visit: Payer: Self-pay

## 2019-09-10 DIAGNOSIS — M7989 Other specified soft tissue disorders: Secondary | ICD-10-CM

## 2019-09-10 DIAGNOSIS — I10 Essential (primary) hypertension: Secondary | ICD-10-CM

## 2019-09-10 MED ORDER — LISINOPRIL 20 MG PO TABS: 20.0000 mg | ORAL_TABLET | Freq: Every day | ORAL | 1 refills | Status: DC

## 2019-09-10 NOTE — Telephone Encounter (Signed)
I called patient to let her know medication was sent to [facility].

## 2019-09-10 NOTE — Telephone Encounter (Signed)
Pt called stating she is completely lout of this medication. Pt states she has been for a few days. Please advise.

## 2019-09-12 ENCOUNTER — Telehealth: Payer: Self-pay

## 2019-09-12 NOTE — Telephone Encounter (Signed)
Copied from [place] [phone]. Topic: Referral - Status >> Sep 12, 2019  5:53 PM Simone Curia D wrote: 74/07/2706 4th attempt unable to leave message voicemail is full. Ambrose Mantle [phone]

## 2019-09-25 ENCOUNTER — Other Ambulatory Visit: Payer: Self-pay | Admitting: Family Medicine

## 2019-09-25 DIAGNOSIS — G43009 Migraine without aura, not intractable, without status migrainosus: Secondary | ICD-10-CM

## 2019-09-25 DIAGNOSIS — F419 Anxiety disorder, unspecified: Secondary | ICD-10-CM

## 2019-09-26 ENCOUNTER — Other Ambulatory Visit: Payer: Self-pay | Admitting: Lab

## 2019-09-26 DIAGNOSIS — M7989 Other specified soft tissue disorders: Secondary | ICD-10-CM

## 2019-09-26 DIAGNOSIS — I10 Essential (primary) hypertension: Secondary | ICD-10-CM

## 2019-09-26 DIAGNOSIS — G43009 Migraine without aura, not intractable, without status migrainosus: Secondary | ICD-10-CM

## 2019-09-26 MED ORDER — BUTALBITAL-APAP-CAFFEINE 50-325-40 MG PO TABS: ORAL_TABLET | ORAL | 0 refills | Status: DC

## 2019-09-26 MED ORDER — HYDROCHLOROTHIAZIDE 25 MG PO TABS: 25.0000 mg | ORAL_TABLET | Freq: Every day | ORAL | 1 refills | Status: AC

## 2019-09-26 MED ORDER — BUTALBITAL-APAP-CAFFEINE 50-325-40 MG PO TABS: ORAL_TABLET | ORAL | 0 refills | Status: AC

## 2019-09-26 NOTE — Telephone Encounter (Signed)
Pharmacy sent a fax for a Rx refill

## 2019-09-26 NOTE — Addendum Note (Signed)
Addended by: Philis Nettle on: 09/26/2019 03:39 PM   Modules accepted: Orders

## 2019-09-26 NOTE — Telephone Encounter (Signed)
Pharmacy sent a refill request.

## 2019-09-27 IMAGING — US ULTRASOUND ABDOMEN LIMITED
1 series · 14 of 25 positions shown · non-contrast
Comparison: None.

CLINICAL DATA: Nausea and vomiting, upper abdominal pain for 4
days.

EXAM:
ULTRASOUND ABDOMEN LIMITED RIGHT UPPER QUADRANT

[Series 1: ultrasound abdomen limited · 14 of 69 slices shown]
[im 1/69]
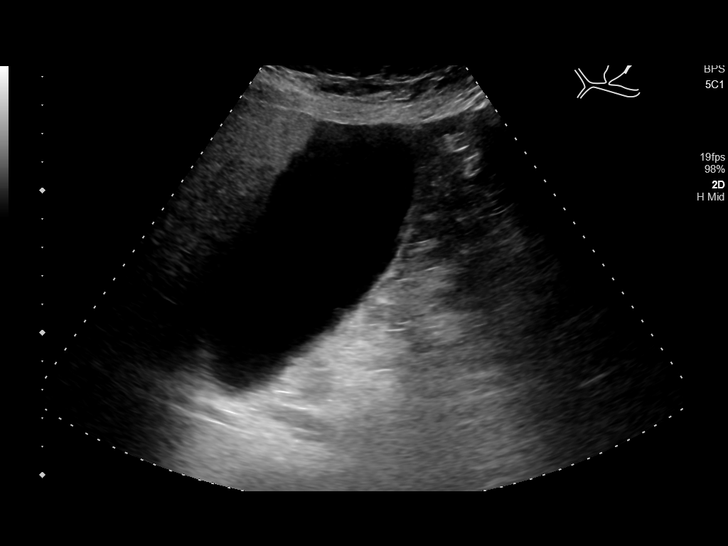
[im 6/69]
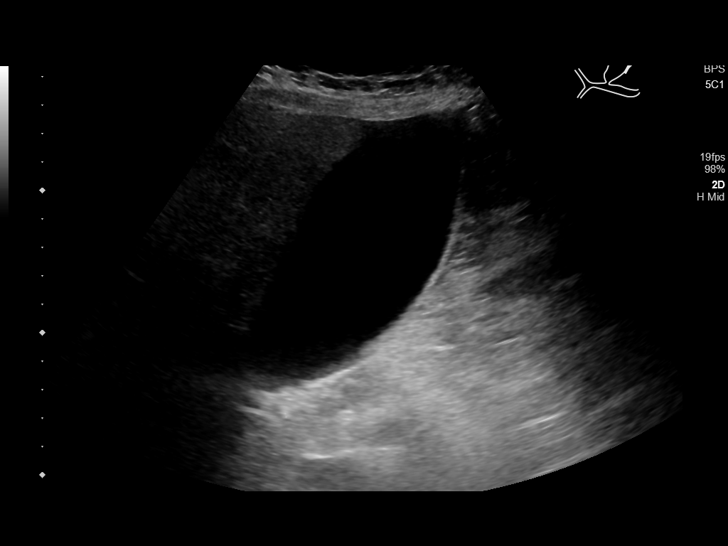
[im 12/69]
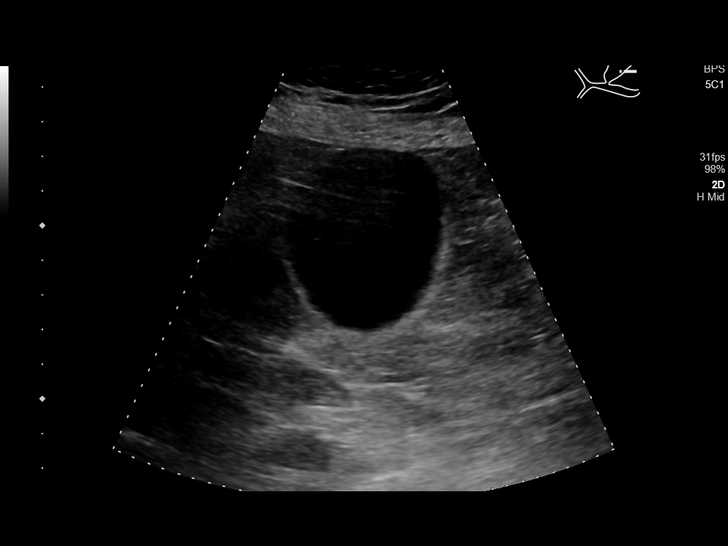
[im 18/69]
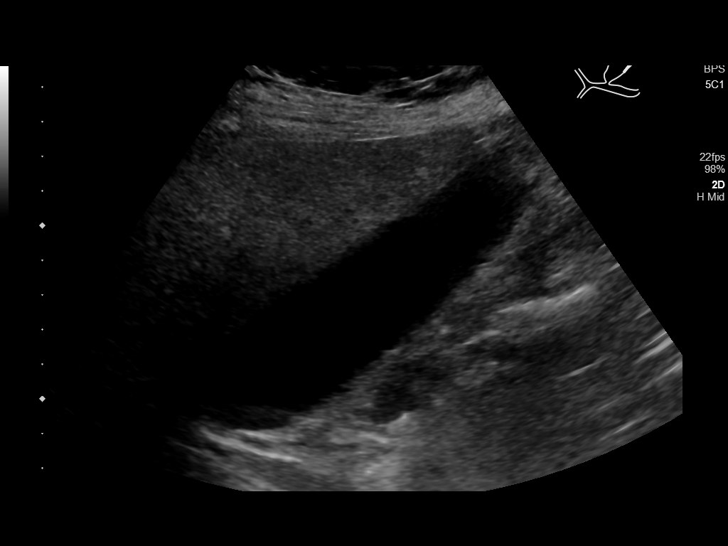
[im 23/69]
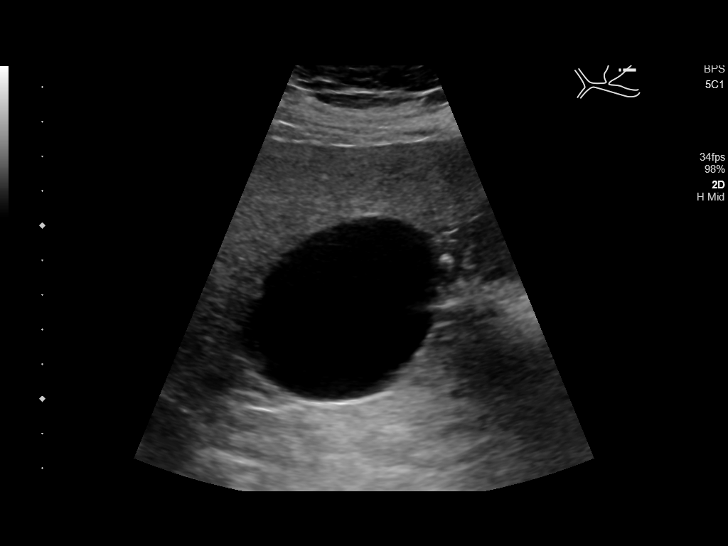
[im 26/69]
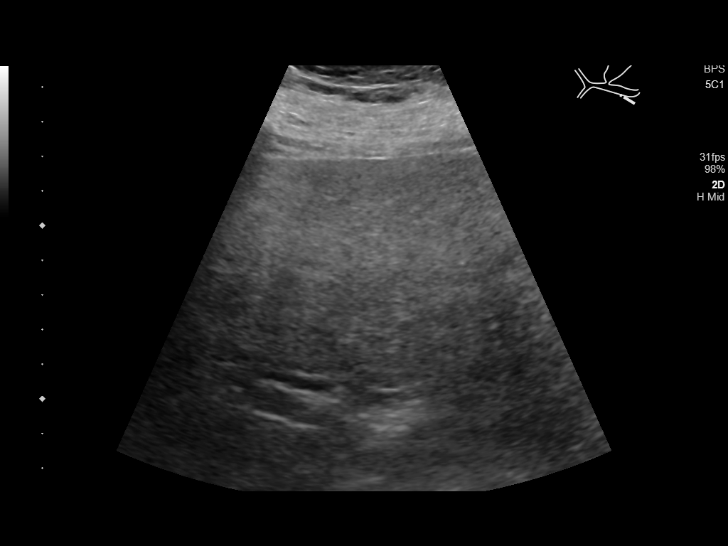
[im 32/69]
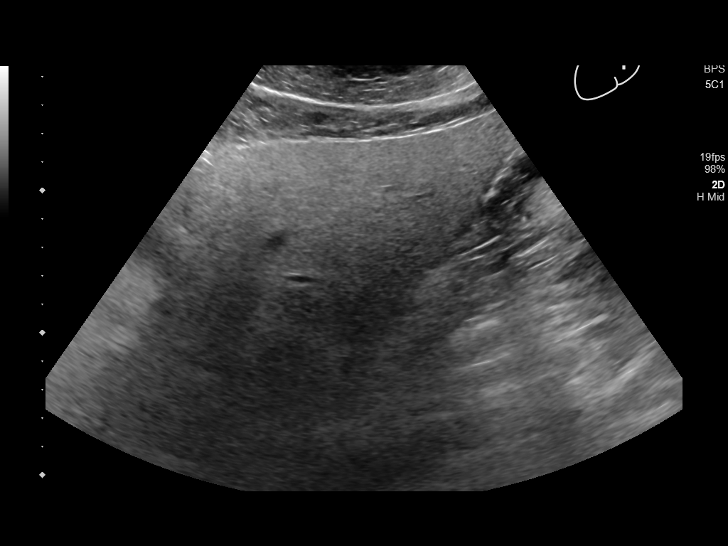
[im 37/69]
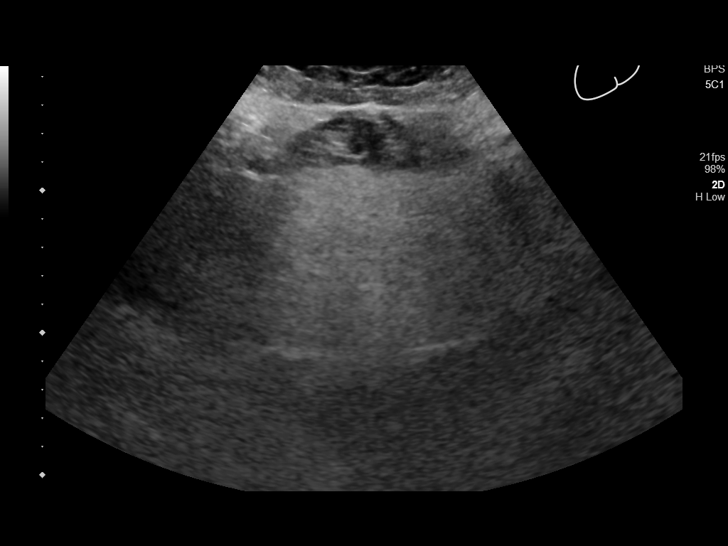
[im 43/69]
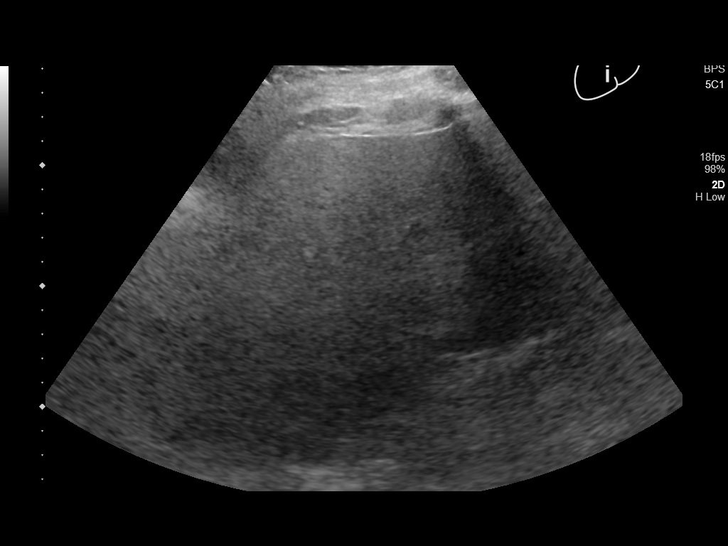
[im 46/69]
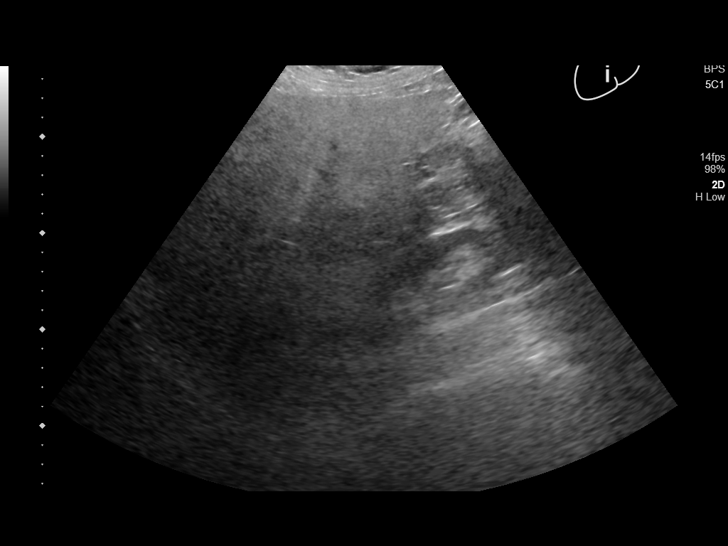
[im 52/69]
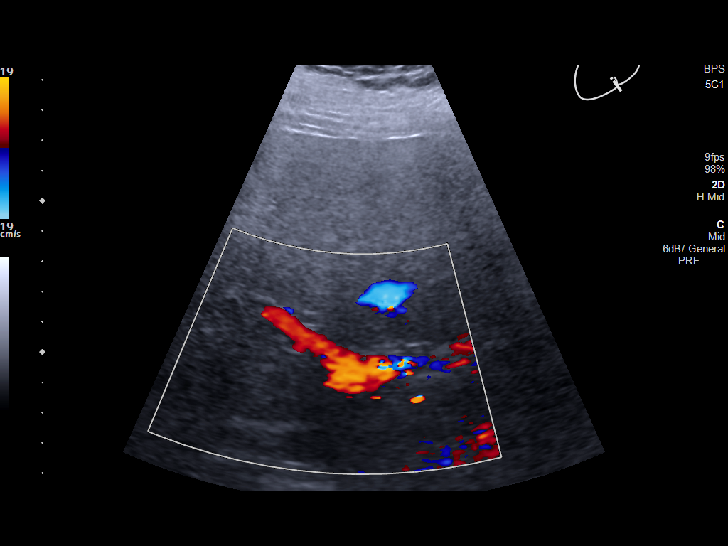
[im 57/69]
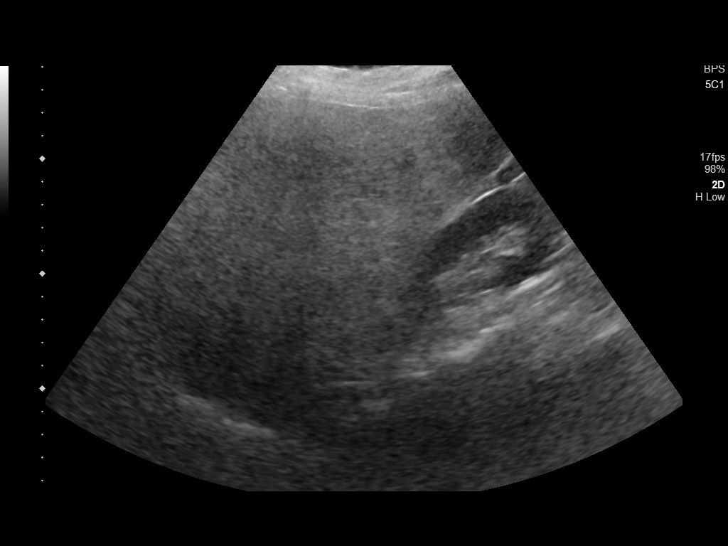
[im 63/69]
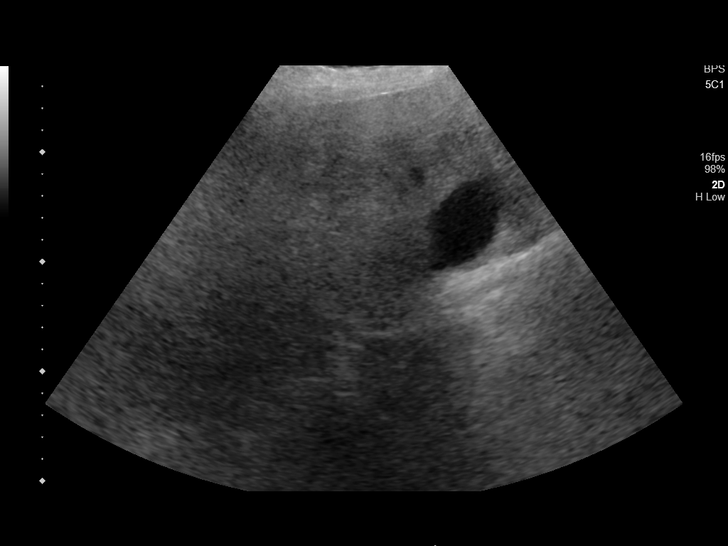
[im 69/69]
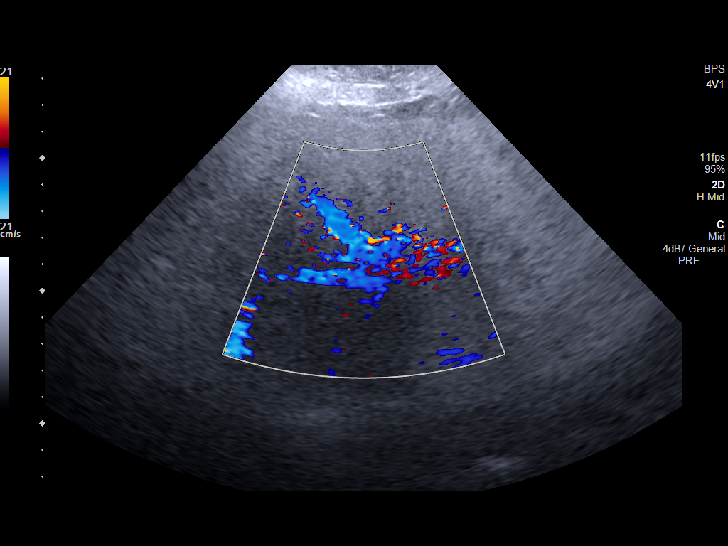

[14 of 25 positions shown; findings below may reference images not displayed]

FINDINGS: Gallbladder:

No gallstones or wall thickening visualized. No sonographic Murphy
sign noted by sonographer.

Common bile duct:

Diameter: 4 mm

Liver:

Diffusely echogenic and difficult to penetrate suggesting fatty
infiltration. No focal lesion identified, although characterization
of the liver parenchyma is limited by the presumed fatty
infiltration and patient body habitus. Portal vein is patent on
color Doppler imaging with normal direction of blood flow towards
the liver.
IMPRESSION: 1. Probable fatty infiltration of the liver.
2. No acute findings.

## 2019-09-29 ENCOUNTER — Other Ambulatory Visit: Payer: Self-pay | Admitting: Lab

## 2019-09-29 DIAGNOSIS — F419 Anxiety disorder, unspecified: Secondary | ICD-10-CM

## 2019-09-29 MED ORDER — ALPRAZOLAM 0.5 MG PO TABS: ORAL_TABLET | ORAL | 2 refills | Status: AC

## 2019-09-29 MED ORDER — ALPRAZOLAM 0.5 MG PO TABS: ORAL_TABLET | ORAL | 0 refills | Status: DC

## 2019-09-29 NOTE — Telephone Encounter (Signed)
Patient has not received her medication refill to pharmacy. I advised her that it is still pending. Please advise

## 2019-09-29 NOTE — Addendum Note (Signed)
Addended by: Philis Nettle on: 09/29/2019 12:08 PM   Modules accepted: Orders

## 2019-10-10 ENCOUNTER — Ambulatory Visit: Payer: Self-pay | Admitting: *Deleted

## 2019-10-10 NOTE — Telephone Encounter (Addendum)
Per initial encounter:  Message from Mathis Bud sent at 10/10/2019 9:29 AM EST  Summary: stomach concern    Patient called requesting further advice on what to do regarding her stomach issues she is having. Patient keeps getting sick on and off for weeks now (throwing up) . Patient states it gets making her dehydrate. Sometimes she is so dehydrated she does not urinate. Patient states today she is keeping liquids now and is using the bathroom today, but no solids. PCP referred her to gastro and GI doctor but cannot see them until JAN. Patient did not want appt with PCP due to patient states she has already done all that she can do for her and just would  Nurse triage line was busy.   Call back [phone]      Will route to office for final disposition.

## 2019-10-10 NOTE — Telephone Encounter (Signed)
LM advising patient to please go to ED or UC if worsening symptoms since Ander Purpura was gone for the day. I stated that she could call back & possible make virtual for Monday if needed.

## 2019-10-13 NOTE — Telephone Encounter (Signed)
LM once again to check on patient & offer virtual visit.

## 2019-12-11 ENCOUNTER — Ambulatory Visit: Payer: Self-pay | Admitting: Family Medicine

## 2019-12-15 ENCOUNTER — Ambulatory Visit: Payer: Self-pay | Admitting: Gastroenterology

## 2020-03-12 ENCOUNTER — Other Ambulatory Visit: Payer: Self-pay | Admitting: Nurse Practitioner

## 2020-03-12 DIAGNOSIS — M7989 Other specified soft tissue disorders: Secondary | ICD-10-CM

## 2020-03-12 DIAGNOSIS — I10 Essential (primary) hypertension: Secondary | ICD-10-CM

## 2020-03-12 MED ORDER — LISINOPRIL 20 MG PO TABS: 20.0000 mg | ORAL_TABLET | Freq: Every day | ORAL | 0 refills | Status: AC

## 2020-03-12 NOTE — Telephone Encounter (Signed)
Pharmacy requesting refill Lisinopril 20 mg patient has New patient appt. 03/16/20 ok to fill, script pended for approval.

## 2020-03-16 ENCOUNTER — Encounter: Payer: Self-pay | Admitting: Nurse Practitioner

## 2020-04-28 ENCOUNTER — Other Ambulatory Visit: Payer: Self-pay

## 2020-04-28 DIAGNOSIS — F419 Anxiety disorder, unspecified: Secondary | ICD-10-CM

## 2020-04-28 DIAGNOSIS — F32A Depression, unspecified: Secondary | ICD-10-CM

## 2020-04-28 MED ORDER — DULOXETINE HCL 60 MG PO CPEP: 120.0000 mg | ORAL_CAPSULE | Freq: Every day | ORAL | 0 refills | Status: DC

## 2020-09-05 ENCOUNTER — Other Ambulatory Visit: Payer: Self-pay | Admitting: Internal Medicine

## 2020-09-05 DIAGNOSIS — F32A Depression, unspecified: Secondary | ICD-10-CM

## 2020-09-05 DIAGNOSIS — F419 Anxiety disorder, unspecified: Secondary | ICD-10-CM
